# Patient Record
Sex: Male | Born: 1935 | Race: White | Hispanic: No | Smoking: Former smoker
Health system: Southern US, Community
[De-identification: ages and names within clinical notes are randomized; demographics above are authoritative.]

## PROBLEM LIST (undated history)

## (undated) DIAGNOSIS — IMO0002 Reserved for concepts with insufficient information to code with codable children: Secondary | ICD-10-CM

## (undated) DIAGNOSIS — I10 Essential (primary) hypertension: Secondary | ICD-10-CM

## (undated) DIAGNOSIS — E785 Hyperlipidemia, unspecified: Secondary | ICD-10-CM

## (undated) HISTORY — DX: Reserved for concepts with insufficient information to code with codable children: IMO0002

## (undated) HISTORY — DX: Essential (primary) hypertension: I10

## (undated) HISTORY — DX: Hyperlipidemia, unspecified: E78.5

---

## 2011-01-19 ENCOUNTER — Inpatient Hospital Stay (INDEPENDENT_AMBULATORY_CARE_PROVIDER_SITE_OTHER)
Admission: RE | Admit: 2011-01-19 | Discharge: 2011-01-19 | Disposition: A | Discharge status: Home / Self Care | Payer: Medicare Other | Source: Ambulatory Visit | Attending: Family Medicine | Admitting: Family Medicine

## 2011-01-19 DIAGNOSIS — I10 Essential (primary) hypertension: Secondary | ICD-10-CM

## 2011-01-20 ENCOUNTER — Ambulatory Visit (INDEPENDENT_AMBULATORY_CARE_PROVIDER_SITE_OTHER): Payer: Medicare Other | Admitting: Family Medicine

## 2011-01-20 ENCOUNTER — Encounter: Payer: Self-pay | Admitting: Family Medicine

## 2011-01-20 VITALS — BP 170/92 | Temp 99.0°F | Wt 158.5 lb

## 2011-01-20 DIAGNOSIS — I10 Essential (primary) hypertension: Secondary | ICD-10-CM

## 2011-01-20 DIAGNOSIS — N289 Disorder of kidney and ureter, unspecified: Secondary | ICD-10-CM

## 2011-01-20 LAB — POCT I-STAT, CHEM 8
BUN: 31 mg/dL — ABNORMAL HIGH (ref 6–23)
Calcium, Ion: 1.13 mmol/L (ref 1.12–1.32)
Chloride: 107 mEq/L (ref 96–112)
Creatinine, Ser: 1.6 mg/dL — ABNORMAL HIGH (ref 0.4–1.5)

## 2011-01-20 LAB — GLUCOSE, CAPILLARY: Glucose-Capillary: 97 mg/dL (ref 70–99)

## 2011-01-20 MED ORDER — LISINOPRIL-HYDROCHLOROTHIAZIDE 20-12.5 MG PO TABS: 1.0000 | ORAL_TABLET | Freq: Every day | ORAL | Status: DC

## 2011-01-20 NOTE — Progress Notes (Signed)
  Subjective:    Patient ID: Randy Cochran, male    DOB: 1926-08-08, 77 y.o.   MRN: [id number]  HPI Here today to establish care.  Previous MD- [facility]).  Moved here in January.  'This all started out w/ me thinking i was diabetic'.  Went to UC- 'they told me no'.  Pt reports he had 'continual thirst', fatigue.  Has hx of HTN, previously on meds but hasn't taken in 'awhile'.  Hasn't seen a doctor in over 2 years.  Pt reports 'i feel fine'.  No CP, SOB, HA (but 'i have had them recently'), edema, visual changes.  Can't recall what meds he used to take.   Review of Systems For ROS see HPI     Objective:   Physical Exam  Constitutional: He appears well-developed. No distress.       Disheveled, unkempt  HENT:  Head: Normocephalic and atraumatic.  Eyes: Conjunctivae and EOM are normal. Pupils are equal, round, and reactive to light.  Neck: Normal range of motion. Neck supple. No thyromegaly present.  Cardiovascular: Normal rate, regular rhythm and intact distal pulses.   Pulmonary/Chest: Effort normal and breath sounds normal. No respiratory distress. He has no wheezes.  Abdominal: Soft. Bowel sounds are normal. He exhibits no distension. There is no tenderness.  Musculoskeletal: He exhibits no edema.  Lymphadenopathy:    He has no cervical adenopathy.  Neurological: He is alert. He has normal reflexes. No cranial nerve deficit.  Skin: Skin is warm and dry.          Assessment & Plan:

## 2011-01-20 NOTE — Patient Instructions (Signed)
Follow up in 2 weeks to recheck your blood pressure and kidney function Drink plenty of water Take your medicine daily for the blood pressure If you have chest pain, shortness of breath, or other concerns- please call or go to the ER Hang in there!!!

## 2011-01-29 ENCOUNTER — Inpatient Hospital Stay (INDEPENDENT_AMBULATORY_CARE_PROVIDER_SITE_OTHER)
Admission: RE | Admit: 2011-01-29 | Discharge: 2011-01-29 | Disposition: A | Discharge status: Home / Self Care | Payer: Medicare Other | Source: Ambulatory Visit | Attending: Family Medicine | Admitting: Family Medicine

## 2011-01-29 ENCOUNTER — Encounter: Payer: Self-pay | Admitting: Family Medicine

## 2011-01-29 DIAGNOSIS — I10 Essential (primary) hypertension: Secondary | ICD-10-CM | POA: Insufficient documentation

## 2011-01-29 DIAGNOSIS — N289 Disorder of kidney and ureter, unspecified: Secondary | ICD-10-CM | POA: Insufficient documentation

## 2011-01-29 NOTE — Assessment & Plan Note (Signed)
Pt's HTN has not been managed medically for at least 2 yrs.  Start Lisinopril HCTZ to bring down pressure.  Reviewed labs from UC- has renal insufficiency so must follow Cr closely w/ addition of ACE.  May need to select another agent if there is a bump.  Stressed the importance of quitting smoking and the need for regular f/u.  Pt expressed understanding.

## 2011-01-29 NOTE — Assessment & Plan Note (Signed)
Based on labs from UC.  Encouraged increased fluid intake.  Will need to repeat labs due to addition of ACE.  If Cr increases will need to select different agent.

## 2011-02-03 ENCOUNTER — Encounter: Payer: Self-pay | Admitting: *Deleted

## 2011-02-03 ENCOUNTER — Ambulatory Visit (INDEPENDENT_AMBULATORY_CARE_PROVIDER_SITE_OTHER): Payer: Medicare Other | Admitting: Family Medicine

## 2011-02-03 DIAGNOSIS — N289 Disorder of kidney and ureter, unspecified: Secondary | ICD-10-CM

## 2011-02-03 DIAGNOSIS — I10 Essential (primary) hypertension: Secondary | ICD-10-CM

## 2011-02-03 LAB — BASIC METABOLIC PANEL
BUN: 31 mg/dL — ABNORMAL HIGH (ref 6–23)
CO2: 29 mEq/L (ref 19–32)
Calcium: 9 mg/dL (ref 8.4–10.5)
Glucose, Bld: 128 mg/dL — ABNORMAL HIGH (ref 70–99)
Sodium: 140 mEq/L (ref 135–145)

## 2011-02-03 MED ORDER — AMLODIPINE BESYLATE 10 MG PO TABS: 10.0000 mg | ORAL_TABLET | Freq: Every day | ORAL | Status: DC

## 2011-02-03 NOTE — Progress Notes (Signed)
  Subjective:    Patient ID: Randy Cochran, male    DOB: 1926/09/13, 77 y.o.   MRN: [id number]  HPI HTN- pt's BP remains elevated.  Went back to UC over the weekend for BP of 220/90.  Home BPs ranging 145-221/68-101.  'i just don't feel good'.  Increased HAs.  No CP, SOB, edema.  Slight dizziness.  Taking Lisinopril BID at advice of UC.   Review of Systems For ROS see HPI     Objective:   Physical Exam Constitutional: He appears well-developed. No distress.       Disheveled, unkempt  HENT:  Head: Normocephalic and atraumatic.  Eyes: Conjunctivae and EOM are normal. Pupils are equal, round, and reactive to light.  Neck: Normal range of motion. Neck supple. No thyromegaly present.  Cardiovascular: Normal rate, regular rhythm and intact distal pulses.   Pulmonary/Chest: Effort normal and breath sounds normal. No respiratory distress. He has no wheezes.  Abdominal: Soft. Bowel sounds are normal. He exhibits no distension. There is no tenderness.  Musculoskeletal: He exhibits no edema.  Lymphadenopathy:    He has no cervical adenopathy.  Neurological: He is alert. He has normal reflexes. No cranial nerve deficit.  Skin: Skin is warm and dry.        Assessment & Plan:

## 2011-02-03 NOTE — Patient Instructions (Signed)
Follow up in 2 weeks to recheck blood pressure We'll notify you of your lab results Add the Amlodipine daily Continue taking the Lisinopril twice daily Call with any questions or concerns Hang in there!

## 2011-02-05 NOTE — Assessment & Plan Note (Signed)
BP remains poorly controlled despite taking Lisinopril bid.  Will recheck BMP due to pt's renal insufficiency.  Add amlodipine to better control pressure.  Stressed the importance of smoking cessation to improve BP.  Will continue to follow closely.

## 2011-02-16 ENCOUNTER — Ambulatory Visit: Payer: Medicare Other | Admitting: Family Medicine

## 2011-02-20 ENCOUNTER — Ambulatory Visit: Payer: Self-pay | Admitting: Family Medicine

## 2011-02-24 ENCOUNTER — Ambulatory Visit (INDEPENDENT_AMBULATORY_CARE_PROVIDER_SITE_OTHER): Payer: Medicare Other | Admitting: Family Medicine

## 2011-02-24 VITALS — BP 120/68 | HR 65 | Temp 98.8°F | Wt 156.0 lb

## 2011-02-24 DIAGNOSIS — I10 Essential (primary) hypertension: Secondary | ICD-10-CM

## 2011-02-24 NOTE — Progress Notes (Signed)
  Subjective:    Patient ID: Randy Cochran, male    DOB: 1926-09-24, 77 y.o.   MRN: [id number]  HPI HTN- home BPs for last 10 days have ranged 112-140/65-75.  Reports feeling better since BP controlled.  No CP, SOB, HAs, edema.   Review of Systems For ROS see HPI     Objective:   Physical Exam Constitutional: He appears well-developed. No distress.       Disheveled, unkempt  HENT:  Head: Normocephalic and atraumatic.  Eyes: Conjunctivae and EOM are normal. Pupils are equal, round, and reactive to light.  Neck: Normal range of motion. Neck supple. No thyromegaly present.  Cardiovascular: Normal rate, regular rhythm and intact distal pulses.   Pulmonary/Chest: Effort normal and breath sounds normal. No respiratory distress. He has no wheezes.  Abdominal: Soft. Bowel sounds are normal. He exhibits no distension. There is no tenderness.  Musculoskeletal: He exhibits no edema.  Lymphadenopathy:    He has no cervical adenopathy.       Assessment & Plan:

## 2011-02-24 NOTE — Assessment & Plan Note (Signed)
Pt's BP now controlled.  Asymptomatic.  No changes at this time.  Will continue to follow at future visits.

## 2011-02-24 NOTE — Patient Instructions (Signed)
Follow up in 3 months for your complete physical Continue your blood pressure meds daily Call if you have any questions or concerns Have a great holiday!!!

## 2011-04-03 ENCOUNTER — Ambulatory Visit (INDEPENDENT_AMBULATORY_CARE_PROVIDER_SITE_OTHER): Payer: Medicare Other | Admitting: Family Medicine

## 2011-04-03 ENCOUNTER — Telehealth: Payer: Self-pay | Admitting: Family Medicine

## 2011-04-03 VITALS — BP 100/60 | Temp 98.7°F | Wt 154.8 lb

## 2011-04-03 DIAGNOSIS — H6690 Otitis media, unspecified, unspecified ear: Secondary | ICD-10-CM

## 2011-04-03 DIAGNOSIS — J029 Acute pharyngitis, unspecified: Secondary | ICD-10-CM

## 2011-04-03 MED ORDER — AMOXICILLIN 500 MG PO CAPS: 500.0000 mg | ORAL_CAPSULE | Freq: Two times a day (BID) | ORAL | Status: AC

## 2011-04-03 NOTE — Patient Instructions (Signed)
Take the Amoxicillin for the ear infection- take w/ food to avoid upset stomach Use tylenol as needed for pain or fever Drink plenty of fluids Call with any questions or concerns Hang in there!!!

## 2011-04-03 NOTE — Telephone Encounter (Signed)
He can come at 4pm- please don't be late

## 2011-04-03 NOTE — Progress Notes (Signed)
  Subjective:    Patient ID: Randy Cochran, male    DOB: 1926-06-06, 77 y.o.   MRN: [id number]  HPI URI- sxs started w/ sore throat.  Now w/ low grade temps.  Slight cough.  Denies nasal congestion.  + L ear pain.  Denies SOB.  No known sick contacts.   Review of Systems For ROS see HPI     Objective:   Physical Exam  Constitutional: He appears well-developed. No distress.       Disheveled, unkempt  HENT:  Head: Normocephalic and atraumatic.  Nose: Nose normal.  Mouth/Throat: Oropharynx is clear and moist.       R TM normal, L TM dull, erythematous, poor landmarks  Eyes: Conjunctivae and EOM are normal. Pupils are equal, round, and reactive to light.  Neck: Normal range of motion. Neck supple.  Cardiovascular: Normal rate, regular rhythm and normal heart sounds.   Pulmonary/Chest: Effort normal and breath sounds normal. No respiratory distress. He has no wheezes. He has no rales.  Lymphadenopathy:    He has no cervical adenopathy.          Assessment & Plan:

## 2011-04-03 NOTE — Telephone Encounter (Signed)
Patient has appt with Dr Beverely Low at 4:00 on 7/2---front desk is aware

## 2011-04-03 NOTE — Assessment & Plan Note (Signed)
Pt's L ear infected.  Start abx.  Reviewed supportive care and red flags that should prompt return.  Pt expressed understanding and is in agreement w/ plan.

## 2011-04-07 ENCOUNTER — Ambulatory Visit: Payer: Medicare Other | Admitting: Family Medicine

## 2011-04-13 ENCOUNTER — Other Ambulatory Visit: Payer: Self-pay | Admitting: Family Medicine

## 2011-04-13 ENCOUNTER — Inpatient Hospital Stay (INDEPENDENT_AMBULATORY_CARE_PROVIDER_SITE_OTHER)
Admission: RE | Admit: 2011-04-13 | Discharge: 2011-04-13 | Disposition: A | Discharge status: Home / Self Care | Payer: Medicare Other | Source: Ambulatory Visit | Attending: Family Medicine | Admitting: Family Medicine

## 2011-04-13 DIAGNOSIS — J029 Acute pharyngitis, unspecified: Secondary | ICD-10-CM

## 2011-04-13 DIAGNOSIS — H6120 Impacted cerumen, unspecified ear: Secondary | ICD-10-CM

## 2011-04-13 NOTE — Telephone Encounter (Signed)
Refill sent.

## 2011-04-14 ENCOUNTER — Ambulatory Visit: Payer: Medicare Other | Admitting: Family Medicine

## 2011-05-07 ENCOUNTER — Inpatient Hospital Stay (INDEPENDENT_AMBULATORY_CARE_PROVIDER_SITE_OTHER)
Admission: RE | Admit: 2011-05-07 | Discharge: 2011-05-07 | Disposition: A | Discharge status: Home / Self Care | Payer: Medicare Other | Source: Ambulatory Visit | Attending: Family Medicine | Admitting: Family Medicine

## 2011-05-07 ENCOUNTER — Inpatient Hospital Stay (HOSPITAL_COMMUNITY): Admission: RE | Admit: 2011-05-07 | Payer: Medicare Other | Source: Ambulatory Visit

## 2011-05-07 DIAGNOSIS — J029 Acute pharyngitis, unspecified: Secondary | ICD-10-CM

## 2011-05-09 ENCOUNTER — Emergency Department (HOSPITAL_COMMUNITY): Payer: Medicare Other

## 2011-05-09 ENCOUNTER — Emergency Department (HOSPITAL_COMMUNITY)
Admission: EM | Admit: 2011-05-09 | Discharge: 2011-05-10 | Disposition: A | Discharge status: Home / Self Care | Payer: Medicare Other | Attending: Emergency Medicine | Admitting: Emergency Medicine

## 2011-05-09 DIAGNOSIS — R599 Enlarged lymph nodes, unspecified: Secondary | ICD-10-CM | POA: Insufficient documentation

## 2011-05-09 DIAGNOSIS — J029 Acute pharyngitis, unspecified: Secondary | ICD-10-CM | POA: Insufficient documentation

## 2011-05-09 DIAGNOSIS — I10 Essential (primary) hypertension: Secondary | ICD-10-CM | POA: Insufficient documentation

## 2011-05-09 DIAGNOSIS — N289 Disorder of kidney and ureter, unspecified: Secondary | ICD-10-CM | POA: Insufficient documentation

## 2011-05-09 DIAGNOSIS — R509 Fever, unspecified: Secondary | ICD-10-CM | POA: Insufficient documentation

## 2011-05-09 LAB — COMPREHENSIVE METABOLIC PANEL
BUN: 48 mg/dL — ABNORMAL HIGH (ref 6–23)
Calcium: 8.8 mg/dL (ref 8.4–10.5)
GFR calc Af Amer: 33 mL/min — ABNORMAL LOW (ref >60)
Glucose, Bld: 187 mg/dL — ABNORMAL HIGH (ref 70–99)
Total Protein: 6.9 g/dL (ref 6.0–8.3)

## 2011-05-09 LAB — CBC
HCT: 34.5 % — ABNORMAL LOW (ref 39.0–52.0)
Hemoglobin: 11.2 g/dL — ABNORMAL LOW (ref 13.0–17.0)
MCH: 29.9 pg (ref 26.0–34.0)
MCHC: 32.5 g/dL (ref 30.0–36.0)

## 2011-05-09 LAB — URINALYSIS, ROUTINE W REFLEX MICROSCOPIC
Leukocytes, UA: NEGATIVE
Nitrite: NEGATIVE
Specific Gravity, Urine: 1.017 (ref 1.005–1.030)
pH: 6 (ref 5.0–8.0)

## 2011-05-09 LAB — DIFFERENTIAL
Basophils Relative: 0 % (ref 0–1)
Eosinophils Relative: 8 % — ABNORMAL HIGH (ref 0–5)
Lymphocytes Relative: 4 % — ABNORMAL LOW (ref 12–46)
Monocytes Absolute: 0.9 10*3/uL (ref 0.1–1.0)
Monocytes Relative: 7 % (ref 3–12)
Neutro Abs: 10.8 10*3/uL — ABNORMAL HIGH (ref 1.7–7.7)

## 2011-05-10 LAB — STREP A DNA PROBE

## 2011-05-15 ENCOUNTER — Ambulatory Visit (INDEPENDENT_AMBULATORY_CARE_PROVIDER_SITE_OTHER): Payer: Medicare Other | Admitting: Family Medicine

## 2011-05-15 ENCOUNTER — Encounter: Payer: Self-pay | Admitting: Family Medicine

## 2011-05-15 DIAGNOSIS — B349 Viral infection, unspecified: Secondary | ICD-10-CM

## 2011-05-15 DIAGNOSIS — N289 Disorder of kidney and ureter, unspecified: Secondary | ICD-10-CM

## 2011-05-15 DIAGNOSIS — E860 Dehydration: Secondary | ICD-10-CM

## 2011-05-15 DIAGNOSIS — B9789 Other viral agents as the cause of diseases classified elsewhere: Secondary | ICD-10-CM

## 2011-05-15 NOTE — Assessment & Plan Note (Signed)
No evidence of bacterial infxn on exam and pt is completing course of Amox.  Encouraged him to finish meds as directed and follow supportive care measures as outlined in ER instructions; rest, fluids.  As stressed the importance of hygiene in good health.  Pt and son express understanding.

## 2011-05-15 NOTE — Assessment & Plan Note (Signed)
Cr was elevated at recent ER visit.  Discussed w/ pt and son that this is likely due to recent illness and poor fluid intake.  Son reports pt 'perked up' after IV fluids- will need to check Cr today.  If Cr remains elevated may need to switch pt off ACE.

## 2011-05-15 NOTE — Patient Instructions (Signed)
This is most likely a virus and should continue to improve w/ time We'll notify you of your lab results Goal is to drink 1 bottle of water in the morning and 1 in the afternoon Your blood pressure looks good- keep up the good work Call with any questions or concerns Hang in there!

## 2011-05-15 NOTE — Assessment & Plan Note (Signed)
Stressed importance of increased fluid intake.  Goal for pt is now 2 bottles of water daily- 1 in the morning and 1 in the afternoon.  Son aware of these recommendations.  Will follow.

## 2011-05-15 NOTE — Progress Notes (Signed)
  Subjective:    Patient ID: Randy Cochran, male    DOB: Jul 20, 1926, 77 y.o.   MRN: [id number]  HPI ER f/u- was seen on 8/8 w/ temp of 102.2 2 days after going to UC and starting Amox for pharyngitis.  In ER Cr was 2.28 and Glu 187.  Pt was afebrile after 1 dose of tylenol.  ER dx'd him w/ viral syndrome.  Today pt reports he still has a sore throat.  L sided ear pain and throat pain.  Last known fever was in the ER.  Still taking the Amoxicillin.  Son reports he rarely drinks water and this is ongoing 'battle' between them.   Review of Systems For ROS see HPI     Objective:   Physical Exam  Vitals reviewed. Constitutional: He is oriented to person, place, and time. No distress.       Disheveled, smells of urine and BO.  Clothes are not clean and fit poorly.  HENT:  Head: Normocephalic and atraumatic.  Nose: Nose normal.  Mouth/Throat: Oropharynx is clear and moist. No oropharyngeal exudate.       No TTP over sinuses TMs WNL bilaterally  Neck: Normal range of motion. Neck supple.  Pulmonary/Chest: Effort normal and breath sounds normal. No respiratory distress. He has no wheezes. He has no rales.  Lymphadenopathy:    He has no cervical adenopathy.  Neurological: He is alert and oriented to person, place, and time. No cranial nerve deficit. Coordination (shuffling gait) abnormal.  Skin: Skin is warm and dry. There is pallor.  Psychiatric: He has a normal mood and affect. His behavior is normal. Judgment and thought content normal.          Assessment & Plan:

## 2011-05-16 LAB — CBC WITH DIFFERENTIAL/PLATELET
Basophils Absolute: 0 10*3/uL (ref 0.0–0.1)
Eosinophils Absolute: 1.4 10*3/uL — ABNORMAL HIGH (ref 0.0–0.7)
Eosinophils Relative: 16.1 % — ABNORMAL HIGH (ref 0.0–5.0)
MCHC: 33.5 g/dL (ref 30.0–36.0)
MCV: 93.4 fl (ref 78.0–100.0)
Monocytes Absolute: 1 10*3/uL (ref 0.1–1.0)
Neutrophils Relative %: 49 % (ref 43.0–77.0)
Platelets: 387 10*3/uL (ref 150.0–400.0)
WBC: 8.5 10*3/uL (ref 4.5–10.5)

## 2011-05-16 LAB — CULTURE, BLOOD (ROUTINE X 2): Culture: NO GROWTH

## 2011-05-16 LAB — BASIC METABOLIC PANEL
BUN: 29 mg/dL — ABNORMAL HIGH (ref 6–23)
Chloride: 108 mEq/L (ref 96–112)
Creatinine, Ser: 1.3 mg/dL (ref 0.4–1.5)

## 2011-05-18 ENCOUNTER — Telehealth: Payer: Self-pay

## 2011-05-18 NOTE — Telephone Encounter (Signed)
Labs mailed

## 2011-05-18 NOTE — Telephone Encounter (Signed)
Message copied by Beverely Low on Thu May 18, 2011 10:52 AM ------      Message from: Sheliah Hatch      Created: Tue May 16, 2011  1:53 PM       Kidney function and potassium are much improved.  No evidence of viral or bacterial infection.

## 2011-05-29 ENCOUNTER — Encounter (INDEPENDENT_AMBULATORY_CARE_PROVIDER_SITE_OTHER): Payer: Medicare Other | Admitting: Ophthalmology

## 2011-05-29 DIAGNOSIS — H3530 Unspecified macular degeneration: Secondary | ICD-10-CM

## 2011-05-29 DIAGNOSIS — H353290 Exudative age-related macular degeneration, unspecified eye, stage unspecified: Secondary | ICD-10-CM

## 2011-05-29 DIAGNOSIS — H4010X0 Unspecified open-angle glaucoma, stage unspecified: Secondary | ICD-10-CM

## 2011-05-29 DIAGNOSIS — H2510 Age-related nuclear cataract, unspecified eye: Secondary | ICD-10-CM

## 2011-05-30 ENCOUNTER — Ambulatory Visit (INDEPENDENT_AMBULATORY_CARE_PROVIDER_SITE_OTHER): Payer: Medicare Other | Admitting: Family Medicine

## 2011-05-30 ENCOUNTER — Encounter: Payer: Self-pay | Admitting: Family Medicine

## 2011-05-30 DIAGNOSIS — Z0000 Encounter for general adult medical examination without abnormal findings: Secondary | ICD-10-CM

## 2011-05-30 DIAGNOSIS — I10 Essential (primary) hypertension: Secondary | ICD-10-CM

## 2011-05-30 LAB — CBC WITH DIFFERENTIAL/PLATELET
Basophils Relative: 0.8 % (ref 0.0–3.0)
Eosinophils Relative: 8.4 % — ABNORMAL HIGH (ref 0.0–5.0)
Lymphocytes Relative: 29.9 % (ref 12.0–46.0)
Monocytes Relative: 9.7 % (ref 3.0–12.0)
Neutrophils Relative %: 51.2 % (ref 43.0–77.0)
RBC: 4.17 Mil/uL — ABNORMAL LOW (ref 4.22–5.81)
WBC: 8.2 10*3/uL (ref 4.5–10.5)

## 2011-05-30 LAB — TSH: TSH: 2.55 u[IU]/mL (ref 0.35–5.50)

## 2011-05-30 LAB — HEPATIC FUNCTION PANEL
ALT: 18 U/L (ref 0–53)
AST: 19 U/L (ref 0–37)
Albumin: 3.9 g/dL (ref 3.5–5.2)
Alkaline Phosphatase: 95 U/L (ref 39–117)

## 2011-05-30 LAB — LIPID PANEL: VLDL: 40.2 mg/dL — ABNORMAL HIGH (ref 0.0–40.0)

## 2011-05-30 LAB — LDL CHOLESTEROL, DIRECT: Direct LDL: 146.3 mg/dL

## 2011-05-30 LAB — BASIC METABOLIC PANEL
BUN: 32 mg/dL — ABNORMAL HIGH (ref 6–23)
Chloride: 107 mEq/L (ref 96–112)
Glucose, Bld: 90 mg/dL (ref 70–99)
Potassium: 3.9 mEq/L (ref 3.5–5.1)

## 2011-05-30 NOTE — Progress Notes (Signed)
  Subjective:    Patient ID: Randy Cochran, male    DOB: [birth year], 77 y.o.   MRN: [id number]  HPI Here today for CPE.  Risk Factors: HTN- chronic problem for pt, fair control today.  Asymptomatic. Glaucoma- pt has 2 eye doctors, Dr Shea Evans and Dr Ashley Royalty.  Is on multiple eye drops.  Son is confused by dx's and meds.   Physical Activity: limited activity Fall risk: denies fall risk, has shuffling gait Depression: denies sxs of depression Hearing: has hearing loss, previously had hearing aides, would like a referral for an audiologist ADL's: independent Cognitive: normal linear thought process, able to answer questions appropriately Home Safety: lives w/ son, feels safe at home Height, Weight, BMI, Visual Acuity: see vitals, vision corrected to 20/20 w/ glasses Counseling: colonoscopy 'a long time ago', family not interested in another.  No needs for PSA at this time. Labs Ordered: See A&P Care Plan: See A&P    Review of Systems Patient reports no vision changes, anorexia, fever ,adenopathy, persistant/recurrent hoarseness, swallowing issues, chest pain, palpitations, edema, persistant/recurrent cough, hemoptysis, dyspnea (rest,exertional, paroxysmal nocturnal), gastrointestinal  bleeding (melena, rectal bleeding), abdominal pain, excessive heart burn, GU symptoms (dysuria, hematuria, voiding/incontinence issues) syncope, focal weakness, memory loss, numbness & tingling, skin/hair/nail changes, depression, anxiety, abnormal bruising/bleeding, musculoskeletal symptoms/signs.     Objective:   Physical Exam BP 140/68  Pulse 64  Temp(Src) 98.6 F (37 C) (Oral)  Resp 18  Ht 5' 5.5" (1.664 m)  Wt 158 lb (71.668 kg)  BMI 25.89 kg/m2  SpO2 98%  General Appearance:    Alert, cooperative, no distress, appears stated age.  Cleaner than usual but smells of urine and BO  Head:    Normocephalic, without obvious abnormality, atraumatic  Eyes:    PERRL, conjunctiva/corneas clear, EOM's intact        Ears:    Normal TM's and external ear canals, both ears  Nose:   Nares normal, septum midline, mucosa normal, no drainage   or sinus tenderness  Throat:   Lips, mucosa, and tongue normal; teeth in poor repair  Neck:   Supple, symmetrical, trachea midline, no adenopathy;       thyroid:  No enlargement/tenderness/nodules  Back:     Symmetric, no curvature, ROM normal, no CVA tenderness  Lungs:     Clear to auscultation bilaterally, respirations unlabored  Chest wall:    No tenderness or deformity  Heart:    Regular rate and rhythm, S1 and S2 normal, no murmur, rub   or gallop  Abdomen:     Soft, non-tender, bowel sounds active all four quadrants,    no masses, no organomegaly  Genitalia:    Normal male without lesion, masses, discharge or tenderness  Rectal:    Normal tone, normal prostate, no masses or tenderness;   guaiac negative stool  Extremities:   Extremities normal, atraumatic, no cyanosis or edema  Pulses:   2+ and symmetric all extremities  Skin:   Skin color, texture, turgor normal, no rashes or lesions  Lymph nodes:   Cervical, supraclavicular, and axillary nodes normal  Neurologic:   CNII-XII intact. Normal strength, sensation and reflexes      throughout          Assessment & Plan:

## 2011-05-30 NOTE — Patient Instructions (Signed)
Follow up in 6 months to recheck blood pressure Someone will call you with your hearing appt Continue your meds We'll notify you of your lab results Call with any questions or concerns Happy Labor Day!

## 2011-05-31 ENCOUNTER — Encounter: Payer: Self-pay | Admitting: Family Medicine

## 2011-05-31 ENCOUNTER — Telehealth: Payer: Self-pay

## 2011-05-31 NOTE — Telephone Encounter (Signed)
Labs mailed

## 2011-05-31 NOTE — Telephone Encounter (Signed)
Message copied by Beverely Low on Wed May 31, 2011  8:42 AM ------      Message from: Sheliah Hatch      Created: Wed May 31, 2011  8:09 AM       Labs look good!  No changes at this time.  Cholesterol and triglycerides are mildly elevated but will improve w/ attention to healthy diet and some regular walking

## 2011-06-01 NOTE — Assessment & Plan Note (Signed)
Pt's PE WNL w/ exception of odor.  Pt and family not interested in colonoscopy.  Heme (-) in office.  No need for PSA at this time.  Anticipatory guidance reviewed w/ pt and son.

## 2011-06-01 NOTE — Assessment & Plan Note (Signed)
BP fairly well controlled.  Due for labs to risk stratify.

## 2011-09-11 ENCOUNTER — Encounter (INDEPENDENT_AMBULATORY_CARE_PROVIDER_SITE_OTHER): Payer: Medicare Other | Admitting: Ophthalmology

## 2011-10-16 ENCOUNTER — Encounter (HOSPITAL_COMMUNITY): Payer: Self-pay

## 2011-10-16 ENCOUNTER — Emergency Department (INDEPENDENT_AMBULATORY_CARE_PROVIDER_SITE_OTHER)
Admission: EM | Admit: 2011-10-16 | Discharge: 2011-10-16 | Disposition: A | Discharge status: Home / Self Care | Payer: Medicare Other | Source: Home / Self Care | Attending: Emergency Medicine | Admitting: Emergency Medicine

## 2011-10-16 ENCOUNTER — Emergency Department (INDEPENDENT_AMBULATORY_CARE_PROVIDER_SITE_OTHER): Payer: Medicare Other

## 2011-10-16 ENCOUNTER — Emergency Department (HOSPITAL_COMMUNITY): Payer: Medicare Other

## 2011-10-16 DIAGNOSIS — R209 Unspecified disturbances of skin sensation: Secondary | ICD-10-CM

## 2011-10-16 DIAGNOSIS — M19049 Primary osteoarthritis, unspecified hand: Secondary | ICD-10-CM

## 2011-10-16 DIAGNOSIS — R202 Paresthesia of skin: Secondary | ICD-10-CM

## 2011-10-16 MED ORDER — MELOXICAM 7.5 MG PO TABS: 7.5000 mg | ORAL_TABLET | Freq: Every day | ORAL | Status: AC

## 2011-10-16 NOTE — ED Notes (Signed)
Patient c/o right hand pain and numbness for 2 weeks; denies injury, denies problem w grip

## 2011-10-16 NOTE — ED Provider Notes (Signed)
History     CSN: [id number]  Arrival date & time 10/16/11  1332   First MD Initiated Contact with Patient 10/16/11 1511      Chief Complaint  Patient presents with  . Hand Problem    (Consider location/radiation/quality/duration/timing/severity/associated sxs/prior treatment) HPI Comments: Thumb pain and numbness on and off for about two weeks" " they couldn't see him at his doctors office" "He has not injured his hand and has not fallen" (son states), "its hurts all over here" Lasecki states pointing at dorsal aspect of 1st MPJ) NO INJURIES, NO REDNESS, NO SWELLING  The history is provided by the patient and a relative.    Past Medical History  Diagnosis Date  . Hypertension   . Hyperlipidemia   . Ulcer     History reviewed. No pertinent past surgical history.  Family History  Problem Relation Age of Onset  . Heart disease Father   . Heart attack Father   . Hypertension Mother     History  Substance Use Topics  . Smoking status: Former Smoker -- 0.5 packs/day  . Smokeless tobacco: Not on file  . Alcohol Use: No      Review of Systems  Constitutional: Negative for fever.  Musculoskeletal: Positive for joint swelling.  Skin: Negative for color change.  Neurological: Positive for numbness. Negative for tremors, weakness and headaches.    Allergies  Review of patient's allergies indicates no known allergies.  Home Medications   Current Outpatient Rx  Name Route Sig Dispense Refill  . AMLODIPINE BESYLATE 10 MG PO TABS Oral Take 1 tablet (10 mg total) by mouth daily. 30 tablet 3  . AMOXICILLIN 500 MG PO TABS Oral Take 500 mg by mouth 2 (two) times daily.      Marland Kitchen BRIMONIDINE TARTRATE-TIMOLOL 0.2-0.5 % OP SOLN Both Eyes Place 1 drop into both eyes every 12 (twelve) hours.      Marland Kitchen LATANOPROST 0.005 % OP SOLN  1 drop at bedtime.      Marland Kitchen LISINOPRIL-HYDROCHLOROTHIAZIDE 20-12.5 MG PO TABS  TAKE ONE TABLET BY MOUTH DAILY 30 tablet 6  . MELOXICAM 7.5 MG PO TABS Oral Take  1 tablet (7.5 mg total) by mouth daily. 14 tablet 0    BP 164/66  Pulse 70  Temp(Src) 98.3 F (36.8 C) (Oral)  Resp 18  SpO2 96%  Physical Exam  Constitutional: He appears well-nourished.  Musculoskeletal: He exhibits tenderness. He exhibits no edema.       Right wrist: He exhibits tenderness and bony tenderness. He exhibits no swelling, no crepitus, no deformity and no laceration.       Arms: Skin: Skin is warm. No abrasion, no bruising, no ecchymosis and no rash noted. No erythema.    ED Course  Procedures (including critical care time)  Labs Reviewed - No data to display Dg Wrist Complete Right  10/16/2011  *RADIOLOGY REPORT*  Clinical Data: Pain in navicular area.  No known injury.  RIGHT WRIST - COMPLETE 3+ VIEW  Comparison: None  Findings: Degenerative changes at the first carpal metacarpal joint. No acute bony abnormality.  Specifically, no fracture, subluxation, or dislocation.  Soft tissues are intact.  IMPRESSION: No acute bony abnormality.  Original Report Authenticated By: Cyndie Chime, M.D.     1. Arthritis of hand   2. Paresthesia       MDM  R thumb pain (non-trauma) x 2 weeks. Able to oppose thumb, and no obvious STS able to discriminate two -points of discrimination- Full  ROM with pain. Moderate arthritis First phalangeal-metacarpal joint. To use splint during the day (rest at night) x 10 days        Jimmie Molly, MD 10/16/11 2010

## 2011-10-21 ENCOUNTER — Encounter (HOSPITAL_COMMUNITY): Payer: Self-pay | Admitting: Emergency Medicine

## 2011-10-21 ENCOUNTER — Emergency Department (INDEPENDENT_AMBULATORY_CARE_PROVIDER_SITE_OTHER)
Admission: EM | Admit: 2011-10-21 | Discharge: 2011-10-21 | Disposition: A | Discharge status: Home / Self Care | Payer: Medicare Other | Source: Home / Self Care | Attending: Family Medicine | Admitting: Family Medicine

## 2011-10-21 DIAGNOSIS — R6889 Other general symptoms and signs: Secondary | ICD-10-CM

## 2011-10-21 MED ORDER — AZITHROMYCIN 250 MG PO TABS: 250.0000 mg | ORAL_TABLET | Freq: Every day | ORAL | Status: AC

## 2011-10-21 NOTE — ED Provider Notes (Signed)
History     CSN: [id number]  Arrival date & time 10/21/11  1659   First MD Initiated Contact with Patient 10/21/11 1854      Chief Complaint  Patient presents with  . URI    (Consider location/radiation/quality/duration/timing/severity/associated sxs/prior treatment) HPI Comments: Randy Cochran presents for evaluation of body aches, feeling hot and cold, minimal cough, and headache. He was seen here recently for pain in his RIGHT thumb and given some medication; his son wonders if this could be related to the medicine (meloxicam).  Patient is a 78 y.o. male presenting with flu symptoms. The history is provided by the patient and a relative.  Influenza This is a new problem. The current episode started more than 2 days ago. The problem occurs constantly. The problem has not changed since onset.Associated symptoms include headaches. Pertinent negatives include no chest pain. The symptoms are aggravated by nothing. The symptoms are relieved by nothing.    Past Medical History  Diagnosis Date  . Hypertension   . Hyperlipidemia   . Ulcer     History reviewed. No pertinent past surgical history.  Family History  Problem Relation Age of Onset  . Heart disease Father   . Heart attack Father   . Hypertension Mother     History  Substance Use Topics  . Smoking status: Former Smoker -- 0.5 packs/day  . Smokeless tobacco: Not on file  . Alcohol Use: No      Review of Systems  Constitutional: Positive for fever and chills.  HENT: Negative for ear pain, congestion, sore throat and rhinorrhea.   Eyes: Negative.   Respiratory: Positive for cough.   Cardiovascular: Negative.  Negative for chest pain.  Gastrointestinal: Negative.   Genitourinary: Negative.   Musculoskeletal: Negative.   Skin: Negative.   Neurological: Positive for headaches.    Allergies  Review of patient's allergies indicates no known allergies.  Home Medications   Current Outpatient Rx  Name Route Sig  Dispense Refill  . ASPIRIN 325 MG PO TABS Oral Take 325 mg by mouth daily.    Marland Kitchen AMLODIPINE BESYLATE 10 MG PO TABS Oral Take 1 tablet (10 mg total) by mouth daily. 30 tablet 3  . AMOXICILLIN 500 MG PO TABS Oral Take 500 mg by mouth 2 (two) times daily.      . AZITHROMYCIN 250 MG PO TABS Oral Take 1 tablet (250 mg total) by mouth daily. Take two tablets on first day, then one tablet each day for four days 6 tablet 0  . BRIMONIDINE TARTRATE-TIMOLOL 0.2-0.5 % OP SOLN Both Eyes Place 1 drop into both eyes every 12 (twelve) hours.      Marland Kitchen LATANOPROST 0.005 % OP SOLN  1 drop at bedtime.      Marland Kitchen LISINOPRIL-HYDROCHLOROTHIAZIDE 20-12.5 MG PO TABS  TAKE ONE TABLET BY MOUTH DAILY 30 tablet 6  . MELOXICAM 7.5 MG PO TABS Oral Take 1 tablet (7.5 mg total) by mouth daily. 14 tablet 0    BP 163/92  Pulse 83  Temp(Src) 97.9 F (36.6 C) (Oral)  Resp 20  SpO2 96%  Physical Exam  Nursing note and vitals reviewed. Constitutional: He is oriented to person, place, and time. He appears well-developed and well-nourished.  HENT:  Head: Normocephalic and atraumatic.  Right Ear: Tympanic membrane normal.  Left Ear: Tympanic membrane normal.  Mouth/Throat: Uvula is midline, oropharynx is clear and moist and mucous membranes are normal.  Eyes: EOM are normal.  Neck: Normal range of motion.  Pulmonary/Chest: Effort  normal and breath sounds normal. He has no wheezes. He has no rhonchi.  Musculoskeletal: Normal range of motion.  Neurological: He is alert and oriented to person, place, and time.  Skin: Skin is warm and dry.  Psychiatric: His behavior is normal.    ED Course  Procedures (including critical care time)  Labs Reviewed - No data to display No results found.   1. Influenza-like illness       MDM  Will treat with azithromycin to cover for CAP        Richardo Priest, MD 10/21/11 2118

## 2011-10-21 NOTE — ED Notes (Signed)
C/o flu -like symptoms: nausea, body aches, chills, sore throat, minimal cough

## 2011-10-25 ENCOUNTER — Emergency Department (INDEPENDENT_AMBULATORY_CARE_PROVIDER_SITE_OTHER)
Admission: EM | Admit: 2011-10-25 | Discharge: 2011-10-25 | Disposition: A | Discharge status: Home / Self Care | Payer: Medicare Other | Source: Home / Self Care | Attending: Emergency Medicine | Admitting: Emergency Medicine

## 2011-10-25 ENCOUNTER — Encounter (HOSPITAL_COMMUNITY): Payer: Self-pay | Admitting: *Deleted

## 2011-10-25 ENCOUNTER — Emergency Department (INDEPENDENT_AMBULATORY_CARE_PROVIDER_SITE_OTHER): Payer: Medicare Other

## 2011-10-25 DIAGNOSIS — R05 Cough: Secondary | ICD-10-CM

## 2011-10-25 MED ORDER — ACETAMINOPHEN-CODEINE #3 300-30 MG PO TABS: 1.0000 | ORAL_TABLET | Freq: Four times a day (QID) | ORAL | Status: AC | PRN

## 2011-10-25 MED ORDER — PREDNISONE 20 MG PO TABS: 40.0000 mg | ORAL_TABLET | Freq: Every day | ORAL | Status: AC

## 2011-10-25 NOTE — ED Provider Notes (Signed)
History     CSN: [id number]  Arrival date & time 10/25/11  1315   First MD Initiated Contact with Patient 10/25/11 1426      Chief Complaint  Patient presents with  . Cough    (Consider location/radiation/quality/duration/timing/severity/associated sxs/prior treatment) HPI Comments: Cough is not doing any better, took antibiotics but nothing else for cough, No SOB, thought that cough should have been gone by now, and its not getting worse"  Patient is a 78 y.o. male presenting with cough. The history is provided by the patient.  Cough This is a recurrent problem. The current episode started more than 1 week ago. The problem occurs constantly. The problem has not changed since onset.The cough is productive of sputum. Associated symptoms include chills. Pertinent negatives include no headaches, no shortness of breath and no wheezing. He has tried decongestants for the symptoms. He is not a smoker. His past medical history does not include COPD or asthma.    Past Medical History  Diagnosis Date  . Hypertension   . Hyperlipidemia   . Ulcer     History reviewed. No pertinent past surgical history.  Family History  Problem Relation Age of Onset  . Heart disease Father   . Heart attack Father   . Hypertension Mother     History  Substance Use Topics  . Smoking status: Former Smoker -- 0.5 packs/day  . Smokeless tobacco: Not on file  . Alcohol Use: No      Review of Systems  Constitutional: Positive for chills. Negative for fever and fatigue.  Respiratory: Positive for cough. Negative for chest tightness, shortness of breath and wheezing.   Skin: Negative for rash.  Neurological: Negative for headaches.    Allergies  Review of patient's allergies indicates no known allergies.  Home Medications   Current Outpatient Rx  Name Route Sig Dispense Refill  . ACETAMINOPHEN-CODEINE #3 300-30 MG PO TABS Oral Take 1-2 tablets by mouth every 6 (six) hours as needed for pain.  15 tablet 0  . AMLODIPINE BESYLATE 10 MG PO TABS Oral Take 1 tablet (10 mg total) by mouth daily. 30 tablet 3  . AMOXICILLIN 500 MG PO TABS Oral Take 500 mg by mouth 2 (two) times daily.      . ASPIRIN 325 MG PO TABS Oral Take 325 mg by mouth daily.    . AZITHROMYCIN 250 MG PO TABS Oral Take 1 tablet (250 mg total) by mouth daily. Take two tablets on first day, then one tablet each day for four days 6 tablet 0  . BRIMONIDINE TARTRATE-TIMOLOL 0.2-0.5 % OP SOLN Both Eyes Place 1 drop into both eyes every 12 (twelve) hours.      Marland Kitchen LATANOPROST 0.005 % OP SOLN  1 drop at bedtime.      Marland Kitchen LISINOPRIL-HYDROCHLOROTHIAZIDE 20-12.5 MG PO TABS  TAKE ONE TABLET BY MOUTH DAILY 30 tablet 6  . MELOXICAM 7.5 MG PO TABS Oral Take 1 tablet (7.5 mg total) by mouth daily. 14 tablet 0  . PREDNISONE 20 MG PO TABS Oral Take 2 tablets (40 mg total) by mouth daily. 2 tablets daily for 5 days 10 tablet 0    BP 173/77  Pulse 68  Temp(Src) 97.1 F (36.2 C) (Oral)  Resp 18  SpO2 100%  Physical Exam  Constitutional: He appears well-developed and well-nourished. No distress.  Pulmonary/Chest: Not tachypneic and not bradypneic. No respiratory distress. He has decreased breath sounds. He has no wheezes. He has no rhonchi. He has no  rales. He exhibits no tenderness.    ED Course  Procedures (including critical care time)  Labs Reviewed - No data to display Dg Chest 2 View  10/25/2011  *RADIOLOGY REPORT*  Clinical Data: Cough.  CHEST - 2 VIEW  Comparison: 05/10/2011  Findings: Heart and mediastinal contours are within normal limits. No focal opacities or effusions.  No acute bony abnormality. Eventration of the right hemidiaphragm, stable.  IMPRESSION: No active cardiopulmonary disease.  Original Report Authenticated By: Cyndie Chime, M.D.     1. Cough       MDM  Returns with recurrent ongoing cough took antibiotic prescribed by previous provider. Comfortable afebrile and non dyspneic, Chest x-ray normal          Jimmie Molly, MD 10/25/11 2018

## 2011-10-25 NOTE — ED Notes (Signed)
Pt  Was  Seen  4  Days  Ago        Was  tx  With  Anti  Biotics  For  Cough      He  aslo  Was  Seen  fot  Thumb injury  As  Well   -  Pt  Continues  To  Have  Cough  And  Symptoms  Are  Perhaps  A  Bit  Worse      He  Is  However  Awake  As  Well as  Alert  He  Is  Speaking in  Complete  sentances

## 2011-10-31 ENCOUNTER — Telehealth: Payer: Self-pay | Admitting: Family Medicine

## 2011-10-31 NOTE — Telephone Encounter (Signed)
Ok for referral to audiology- dx hearing loss 

## 2011-11-01 ENCOUNTER — Telehealth: Payer: Self-pay | Admitting: Family Medicine

## 2011-11-01 NOTE — Telephone Encounter (Signed)
Gave message to Referral Coordinator per pt request.

## 2011-11-01 NOTE — Telephone Encounter (Signed)
Patient's son called the office asking to speak with someone who can help his father Randy Cochran get a referral for his hearing aid. The referral needs to be sent to specialist ( [facility] located at [address]). Their fax number is [phone]. Randy Cochran can be best reached at [phone].

## 2011-11-01 NOTE — Telephone Encounter (Signed)
Referral sent for audiology per hearing loss

## 2011-11-03 ENCOUNTER — Ambulatory Visit (INDEPENDENT_AMBULATORY_CARE_PROVIDER_SITE_OTHER): Payer: Medicare Other | Admitting: Family Medicine

## 2011-11-03 ENCOUNTER — Encounter: Payer: Self-pay | Admitting: Family Medicine

## 2011-11-03 DIAGNOSIS — R05 Cough: Secondary | ICD-10-CM

## 2011-11-03 DIAGNOSIS — R209 Unspecified disturbances of skin sensation: Secondary | ICD-10-CM

## 2011-11-03 DIAGNOSIS — R059 Cough, unspecified: Secondary | ICD-10-CM | POA: Insufficient documentation

## 2011-11-03 DIAGNOSIS — R200 Anesthesia of skin: Secondary | ICD-10-CM

## 2011-11-03 MED ORDER — LORATADINE 10 MG PO TABS: 10.0000 mg | ORAL_TABLET | Freq: Every day | ORAL | Status: DC

## 2011-11-03 NOTE — Assessment & Plan Note (Signed)
Most likely viral/allergy combo due to presence of PND.  No evidence of infection.  No need for abx.  Start Claritin daily.  Cough meds prn.  Reviewed supportive care and red flags that should prompt return.  Pt expressed understanding and is in agreement w/ plan.

## 2011-11-03 NOTE — Progress Notes (Signed)
  Subjective:    Patient ID: Randy Cochran, male    DOB: 08-17-1926, 78 y.o.   MRN: [id number]  HPI Cough- sxs started 'a couple of week ago'.  decribes chest congestion but cough is not productive.  No fevers.  Cough is worse at night.  Denies nasal congestion, sinus pain.  + L ear pain- but this started after getting fitted for hearing aide.  No N/V/D.  + sick contacts.  R thumb numbness- no pain w/ movement.  Was dx'd w/ tendonitis at [facility].  Started on Mobic.  No swelling or redness.  Was recently immobilized for 10 days w/out relief of sxs.  sxs started 2-3 weeks ago.  Denies weakness.   Review of Systems For ROS see HPI     Objective:   Physical Exam  Constitutional: He appears well-developed and well-nourished. No distress.  HENT:  Head: Normocephalic and atraumatic.  Right Ear: Tympanic membrane normal.  Left Ear: Tympanic membrane normal.  Nose: No mucosal edema or rhinorrhea. Right sinus exhibits no maxillary sinus tenderness and no frontal sinus tenderness. Left sinus exhibits no maxillary sinus tenderness and no frontal sinus tenderness.  Mouth/Throat: Mucous membranes are normal. No oropharyngeal exudate, posterior oropharyngeal edema or posterior oropharyngeal erythema.       No TTP over sinuses + turbinate edema + PND TMs normal bilaterally  Eyes: Conjunctivae and EOM are normal. Pupils are equal, round, and reactive to light.  Neck: Normal range of motion. Neck supple.  Cardiovascular: Normal rate, regular rhythm and normal heart sounds.   Pulmonary/Chest: Effort normal and breath sounds normal. No respiratory distress. He has no wheezes.       No cough heard  Musculoskeletal: He exhibits no edema and no tenderness.       R thumb w/out swelling, redness, decreased motion.  Subjective numbness  Lymphadenopathy:    He has no cervical adenopathy.  Skin: Skin is warm and dry.          Assessment & Plan:

## 2011-11-03 NOTE — Assessment & Plan Note (Signed)
Pt w/out any abnormality on PE.  Refer to hand surgery for complete evaluation and tx.  Pt expressed understanding and is in agreement w/ plan.

## 2011-11-03 NOTE — Patient Instructions (Signed)
The cough appears to be a virus/allergy combo and should improve w/ time Start Claritin daily to improve your allergy symptoms Drink plenty of fluids Someone will call you with your hand specialist appt Call with any questions or concerns Hang in there!

## 2011-11-30 ENCOUNTER — Encounter: Payer: Self-pay | Admitting: Family Medicine

## 2011-11-30 ENCOUNTER — Ambulatory Visit (INDEPENDENT_AMBULATORY_CARE_PROVIDER_SITE_OTHER): Payer: Medicare Other | Admitting: Family Medicine

## 2011-11-30 DIAGNOSIS — N289 Disorder of kidney and ureter, unspecified: Secondary | ICD-10-CM

## 2011-11-30 DIAGNOSIS — J309 Allergic rhinitis, unspecified: Secondary | ICD-10-CM

## 2011-11-30 DIAGNOSIS — J302 Other seasonal allergic rhinitis: Secondary | ICD-10-CM | POA: Insufficient documentation

## 2011-11-30 DIAGNOSIS — E785 Hyperlipidemia, unspecified: Secondary | ICD-10-CM | POA: Insufficient documentation

## 2011-11-30 DIAGNOSIS — I10 Essential (primary) hypertension: Secondary | ICD-10-CM

## 2011-11-30 LAB — CBC WITH DIFFERENTIAL/PLATELET
Basophils Relative: 0.6 % (ref 0.0–3.0)
Eosinophils Relative: 5.7 % — ABNORMAL HIGH (ref 0.0–5.0)
HCT: 42.2 % (ref 39.0–52.0)
Hemoglobin: 14.1 g/dL (ref 13.0–17.0)
Lymphs Abs: 2.6 10*3/uL (ref 0.7–4.0)
Monocytes Relative: 8.9 % (ref 3.0–12.0)
Neutro Abs: 6 10*3/uL (ref 1.4–7.7)
Platelets: 312 10*3/uL (ref 150.0–400.0)
RBC: 4.58 Mil/uL (ref 4.22–5.81)
WBC: 10.1 10*3/uL (ref 4.5–10.5)

## 2011-11-30 LAB — HEPATIC FUNCTION PANEL
Albumin: 4.1 g/dL (ref 3.5–5.2)
Alkaline Phosphatase: 93 U/L (ref 39–117)
Total Protein: 7.2 g/dL (ref 6.0–8.3)

## 2011-11-30 LAB — LIPID PANEL: HDL: 41 mg/dL (ref >39.00)

## 2011-11-30 LAB — BASIC METABOLIC PANEL
BUN: 32 mg/dL — ABNORMAL HIGH (ref 6–23)
CO2: 28 mEq/L (ref 19–32)
Calcium: 9 mg/dL (ref 8.4–10.5)
Glucose, Bld: 77 mg/dL (ref 70–99)
Sodium: 140 mEq/L (ref 135–145)

## 2011-11-30 MED ORDER — LISINOPRIL-HYDROCHLOROTHIAZIDE 20-12.5 MG PO TABS: 1.0000 | ORAL_TABLET | Freq: Every day | ORAL | Status: DC

## 2011-11-30 MED ORDER — LORATADINE 10 MG PO TABS: 10.0000 mg | ORAL_TABLET | Freq: Every day | ORAL | Status: AC

## 2011-11-30 MED ORDER — AMLODIPINE BESYLATE 5 MG PO TABS: 5.0000 mg | ORAL_TABLET | Freq: Every day | ORAL | Status: DC

## 2011-11-30 NOTE — Patient Instructions (Signed)
Follow up in 1 month to recheck blood pressure Continue the Lisinopril daily ADD the Norvasc daily We'll notify you of your lab results Call with any questions or concerns Hang in there!!

## 2011-11-30 NOTE — Progress Notes (Signed)
  Subjective:    Patient ID: Randy Cochran, male    DOB: 27-Oct-1925, 78 y.o.   MRN: [id number]  HPI HTN- chronic problem, elevated BP today.  On Lisinopril but no longer taking the Norvasc.  No CP, SOB, edema.  + HA- frontal.  HA will improve w/ 1 ASA.  Hyperlipidemia- labs 6 months ago show elevated trigs and cholesterol.  Not currently on meds, was trying to control w/ diet and exercise.  Renal insufficiency- due for repeat labs.  No urinary complaints.   Review of Systems For ROS see HPI     Objective:   Physical Exam  Vitals reviewed. Constitutional: He is oriented to person, place, and time. He appears well-developed and well-nourished. No distress.  HENT:  Head: Normocephalic and atraumatic.       No TTP over sinuses + turbinate edema + PND TMs normal bilaterally  Eyes: Conjunctivae and EOM are normal. Pupils are equal, round, and reactive to light.  Neck: Normal range of motion. Neck supple. No thyromegaly present.  Cardiovascular: Normal rate, regular rhythm, normal heart sounds and intact distal pulses.   No murmur heard. Pulmonary/Chest: Effort normal and breath sounds normal. No respiratory distress. He has no wheezes.  Abdominal: Soft. Bowel sounds are normal. He exhibits no distension.  Musculoskeletal: He exhibits no edema.  Lymphadenopathy:    He has no cervical adenopathy.  Neurological: He is alert and oriented to person, place, and time. No cranial nerve deficit.  Skin: Skin is warm and dry.  Psychiatric: He has a normal mood and affect. His behavior is normal.          Assessment & Plan:

## 2011-12-03 NOTE — Assessment & Plan Note (Signed)
Chronic problem.  Not well controlled.  Stressed the importance of taking both the Lisinopril HCT and Norvasc.  Will follow closely.

## 2011-12-03 NOTE — Assessment & Plan Note (Signed)
Chronic issue for pt.  Check labs to assess for change.

## 2011-12-03 NOTE — Assessment & Plan Note (Signed)
Chronic problem.  Has been attempting to control w/ diet.  Due for repeat labs today.  Will start med prn.  Pt expressed understanding and is in agreement w/ plan.

## 2011-12-03 NOTE — Assessment & Plan Note (Signed)
New.  This is likely cause of pt's frontal HAs.  Start daily antihistamine.  Script provided.  Pt expressed understanding and is in agreement w/ plan.

## 2011-12-08 ENCOUNTER — Telehealth: Payer: Self-pay

## 2011-12-08 ENCOUNTER — Encounter: Payer: Self-pay | Admitting: Family Medicine

## 2011-12-08 MED ORDER — SIMVASTATIN 20 MG PO TABS: 20.0000 mg | ORAL_TABLET | Freq: Every evening | ORAL | Status: AC

## 2011-12-08 NOTE — Telephone Encounter (Signed)
Spoke with patient's son, all information ok'd, ok for rx to be sent to [facility] on [address]. Copy of report to be mailed per son's request

## 2011-12-08 NOTE — Telephone Encounter (Signed)
Message copied by Maurice Small on Fri Dec 08, 2011  1:58 PM ------      Message from: Sheliah Hatch      Created: Thu Nov 30, 2011  4:48 PM       Kidney function is stable      Total cholesterol, LDL, and trigs are all elevated.  Based on this, would start Simvastatin 20mg  nightly

## 2011-12-28 ENCOUNTER — Ambulatory Visit: Payer: Medicare Other | Admitting: Family Medicine

## 2012-01-01 ENCOUNTER — Encounter: Payer: Self-pay | Admitting: Family Medicine

## 2012-01-01 ENCOUNTER — Ambulatory Visit (INDEPENDENT_AMBULATORY_CARE_PROVIDER_SITE_OTHER): Payer: Medicare Other | Admitting: Family Medicine

## 2012-01-01 VITALS — BP 129/82 | HR 64 | Temp 98.1°F | Ht 64.75 in | Wt 157.8 lb

## 2012-01-01 DIAGNOSIS — I10 Essential (primary) hypertension: Secondary | ICD-10-CM

## 2012-01-01 NOTE — Assessment & Plan Note (Signed)
Improved.  Pt has resumed taking meds as directed.  Asymptomatic.  No changes at this time.  Will continue to follow.

## 2012-01-01 NOTE — Progress Notes (Signed)
  Subjective:    Patient ID: Randy Cochran, male    DOB: 06-03-1926, 78 y.o.   MRN: [id number]  HPI HTN- restarted Norvasc at last vitis.  Now on Lisinopril HCT and Norvasc.  Denies CP, SOB, HAs, visual changes, edema.   Review of Systems For ROS see HPI     Objective:   Physical Exam  Vitals reviewed. Constitutional: He is oriented to person, place, and time. He appears well-developed and well-nourished. No distress.       Disheveled  HENT:  Head: Normocephalic and atraumatic.  Eyes: Conjunctivae and EOM are normal. Pupils are equal, round, and reactive to light.  Neck: Normal range of motion. Neck supple. No thyromegaly present.  Cardiovascular: Normal rate, regular rhythm, normal heart sounds and intact distal pulses.   No murmur heard. Pulmonary/Chest: Effort normal and breath sounds normal. No respiratory distress.  Abdominal: Soft. Bowel sounds are normal. He exhibits no distension.  Musculoskeletal: He exhibits no edema.  Lymphadenopathy:    He has no cervical adenopathy.  Neurological: He is alert and oriented to person, place, and time. No cranial nerve deficit.  Skin: Skin is warm and dry.  Psychiatric: He has a normal mood and affect. His behavior is normal.          Assessment & Plan:

## 2012-01-01 NOTE — Patient Instructions (Signed)
Schedule your complete physical for August- don't eat before this appt Continue the Norvasc and the Lisinopril- your blood pressure looks much better! Call with any questions or concerns Happy Spring!

## 2012-05-02 ENCOUNTER — Encounter: Payer: Medicare Other | Admitting: Family Medicine

## 2012-06-19 ENCOUNTER — Ambulatory Visit: Payer: Medicare Other | Admitting: Family Medicine

## 2012-06-20 ENCOUNTER — Ambulatory Visit (INDEPENDENT_AMBULATORY_CARE_PROVIDER_SITE_OTHER): Payer: Medicare Other | Admitting: Family Medicine

## 2012-06-20 ENCOUNTER — Encounter: Payer: Self-pay | Admitting: Family Medicine

## 2012-06-20 VITALS — BP 130/78 | HR 62 | Temp 98.4°F | Ht 65.0 in | Wt 156.2 lb

## 2012-06-20 DIAGNOSIS — I10 Essential (primary) hypertension: Secondary | ICD-10-CM

## 2012-06-20 DIAGNOSIS — J309 Allergic rhinitis, unspecified: Secondary | ICD-10-CM

## 2012-06-20 DIAGNOSIS — E785 Hyperlipidemia, unspecified: Secondary | ICD-10-CM

## 2012-06-20 DIAGNOSIS — J302 Other seasonal allergic rhinitis: Secondary | ICD-10-CM

## 2012-06-20 LAB — BASIC METABOLIC PANEL
CO2: 25 mEq/L (ref 19–32)
Glucose, Bld: 114 mg/dL — ABNORMAL HIGH (ref 70–99)
Potassium: 3.4 mEq/L — ABNORMAL LOW (ref 3.5–5.1)
Sodium: 139 mEq/L (ref 135–145)

## 2012-06-20 LAB — HEPATIC FUNCTION PANEL
AST: 20 U/L (ref 0–37)
Albumin: 4 g/dL (ref 3.5–5.2)
Alkaline Phosphatase: 111 U/L (ref 39–117)
Total Protein: 7.2 g/dL (ref 6.0–8.3)

## 2012-06-20 NOTE — Progress Notes (Signed)
  Subjective:    Patient ID: Randy Cochran, male    DOB: 1926/09/25, 78 y.o.   MRN: [id number]  HPI HTN- chronic problem, on Norvasc and Lisinopril HCT.  They were checking BP at pharmacy and it was 'really high'.  Switched pharmacy and #s improved.  No CP, SOB, visual changes, edema.  + HA.  HAs- occuring daily, across the top of the head.  sxs started 'several weeks ago'.  Improves w/ ASA.  Nothing makes them worse.  Some nasal congestion.  Recently started taking Claritin daily- previously taking as needed.  HAs have improved since changing Claritin.  Hyperlipidemia- dx'd in 2/13 and was supposed to start Zocor.  Did not start med.   Review of Systems For ROS see HPI     Objective:   Physical Exam  Vitals reviewed. Constitutional: He is oriented to person, place, and time. He appears well-developed and well-nourished. No distress.       Disheveled, malodorous  HENT:  Head: Normocephalic and atraumatic.       + edematous nasal turbinates + PND No TTP over sinuses  Eyes: Conjunctivae normal and EOM are normal. Pupils are equal, round, and reactive to light.  Neck: Normal range of motion. Neck supple. No thyromegaly present.  Cardiovascular: Normal rate, regular rhythm, normal heart sounds and intact distal pulses.   No murmur heard. Pulmonary/Chest: Effort normal and breath sounds normal. No respiratory distress.  Abdominal: Soft. Bowel sounds are normal. He exhibits no distension.  Musculoskeletal: He exhibits no edema.  Lymphadenopathy:    He has no cervical adenopathy.  Neurological: He is alert and oriented to person, place, and time. No cranial nerve deficit.  Skin: Skin is warm and dry.  Psychiatric: He has a normal mood and affect. His behavior is normal.          Assessment & Plan:

## 2012-06-20 NOTE — Assessment & Plan Note (Signed)
Chronic problem, never started Simvastatin.  Will check labs today and start meds prn.  Pt expressed understanding and is in agreement w/ plan.

## 2012-06-20 NOTE — Patient Instructions (Addendum)
Schedule your complete physical in 6 months We'll notify you of your lab results and make any changes if needed (including starting cholesterol meds if needed) Continue the claritin daily for the seasonal allergies Tylenol as needed for your headaches Drink plenty of fluids Call with any questions or concerns Hang in there!!

## 2012-06-20 NOTE — Addendum Note (Signed)
Addended by: Derry Lory A on: 06/20/2012 02:31 PM   Modules accepted: Orders

## 2012-06-20 NOTE — Assessment & Plan Note (Signed)
Chronic problem.  Adequate control today.  Asymptomatic.  Check labs.  Will continue to follow closely.

## 2012-06-20 NOTE — Assessment & Plan Note (Signed)
Deteriorated.  This is likely cause of pt's HAs as he reports they have improved since starting Claritin daily.  Reviewed importance of daily use w/ both pt and son- they expressed understanding.

## 2012-06-21 ENCOUNTER — Encounter: Payer: Medicare Other | Admitting: Family Medicine

## 2012-07-05 ENCOUNTER — Telehealth: Payer: Self-pay | Admitting: *Deleted

## 2012-07-05 NOTE — Telephone Encounter (Signed)
Called patient home number. Unable to leave message. Voice mail box is full.

## 2012-07-05 NOTE — Telephone Encounter (Signed)
Message copied by Elnora Morrison on Fri Jul 05, 2012 12:31 PM ------      Message from: Sheliah Hatch      Created: Thu Jun 20, 2012  4:46 PM       Total cholesterol, trigs and LDL are all elevated.  Needs to start Lipitor 10mg  nightly.  Please make sure pt and sun understand.

## 2012-07-09 ENCOUNTER — Encounter: Payer: Self-pay | Admitting: *Deleted

## 2012-07-09 MED ORDER — ATORVASTATIN CALCIUM 10 MG PO TABS: 10.0000 mg | ORAL_TABLET | Freq: Every day | ORAL | Status: DC

## 2012-07-09 NOTE — Addendum Note (Signed)
Addended by: Derry Lory A on: 07/09/2012 12:03 PM   Modules accepted: Orders

## 2012-07-16 ENCOUNTER — Other Ambulatory Visit: Payer: Self-pay | Admitting: Family Medicine

## 2012-07-16 MED ORDER — LISINOPRIL-HYDROCHLOROTHIAZIDE 20-12.5 MG PO TABS: 1.0000 | ORAL_TABLET | Freq: Every day | ORAL | Status: DC

## 2012-07-16 NOTE — Telephone Encounter (Signed)
rx sent to pharmacy by e-script  

## 2012-07-16 NOTE — Telephone Encounter (Signed)
LISINOP/HCTZ 20 -12.5 TAB QTY :30 TAKE ONE TABLET BY MOUTH EVERYDAY

## 2012-07-23 ENCOUNTER — Telehealth: Payer: Self-pay | Admitting: Family Medicine

## 2012-07-29 ENCOUNTER — Encounter: Payer: Medicare Other | Admitting: Family Medicine

## 2012-08-04 IMAGING — CR DG CHEST 2V
2 series · 2 of 2 positions shown · non-contrast
Comparison: None.

CLINICAL DATA: Fever, weakness

CHEST - 2 VIEW

[w chest lat]
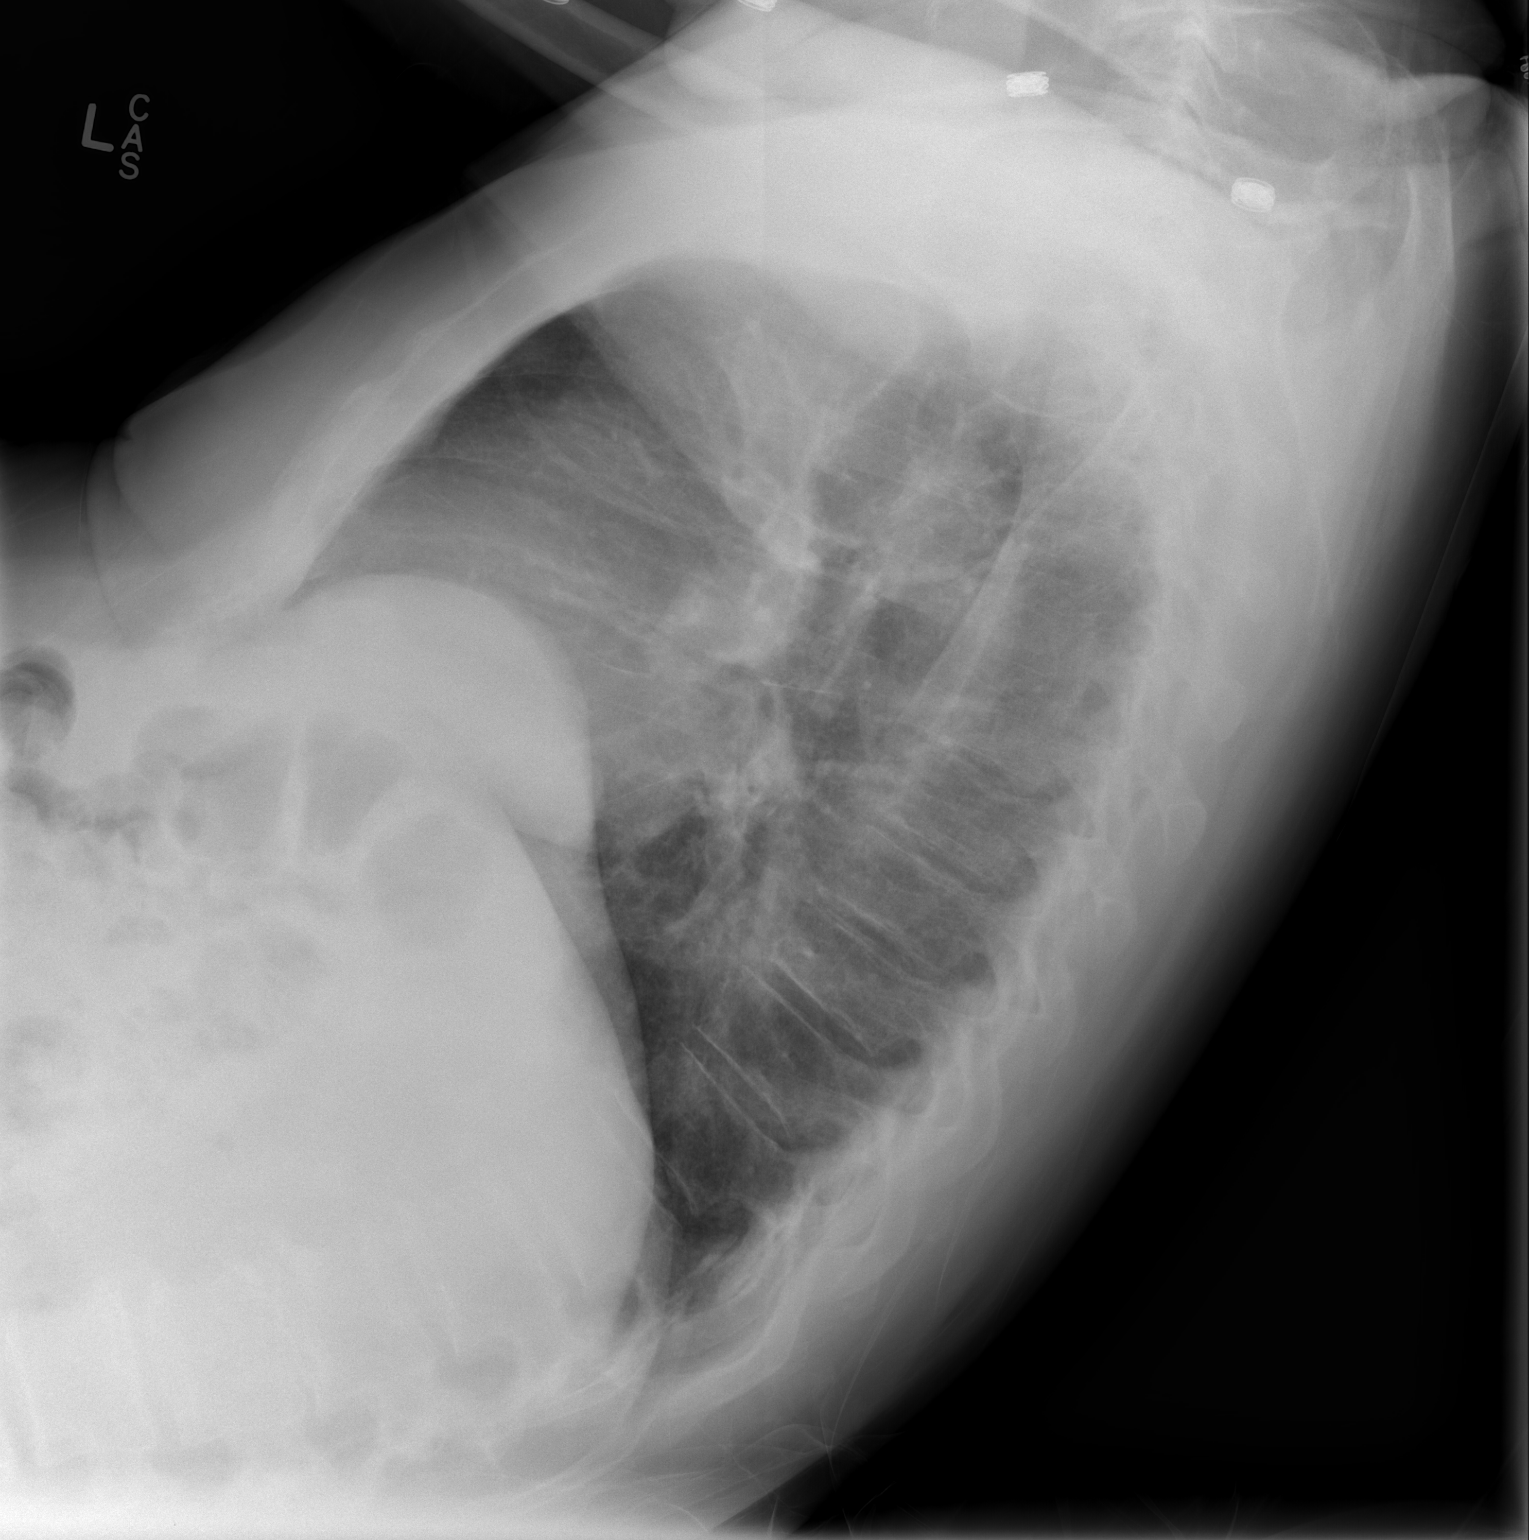

[view not recorded]
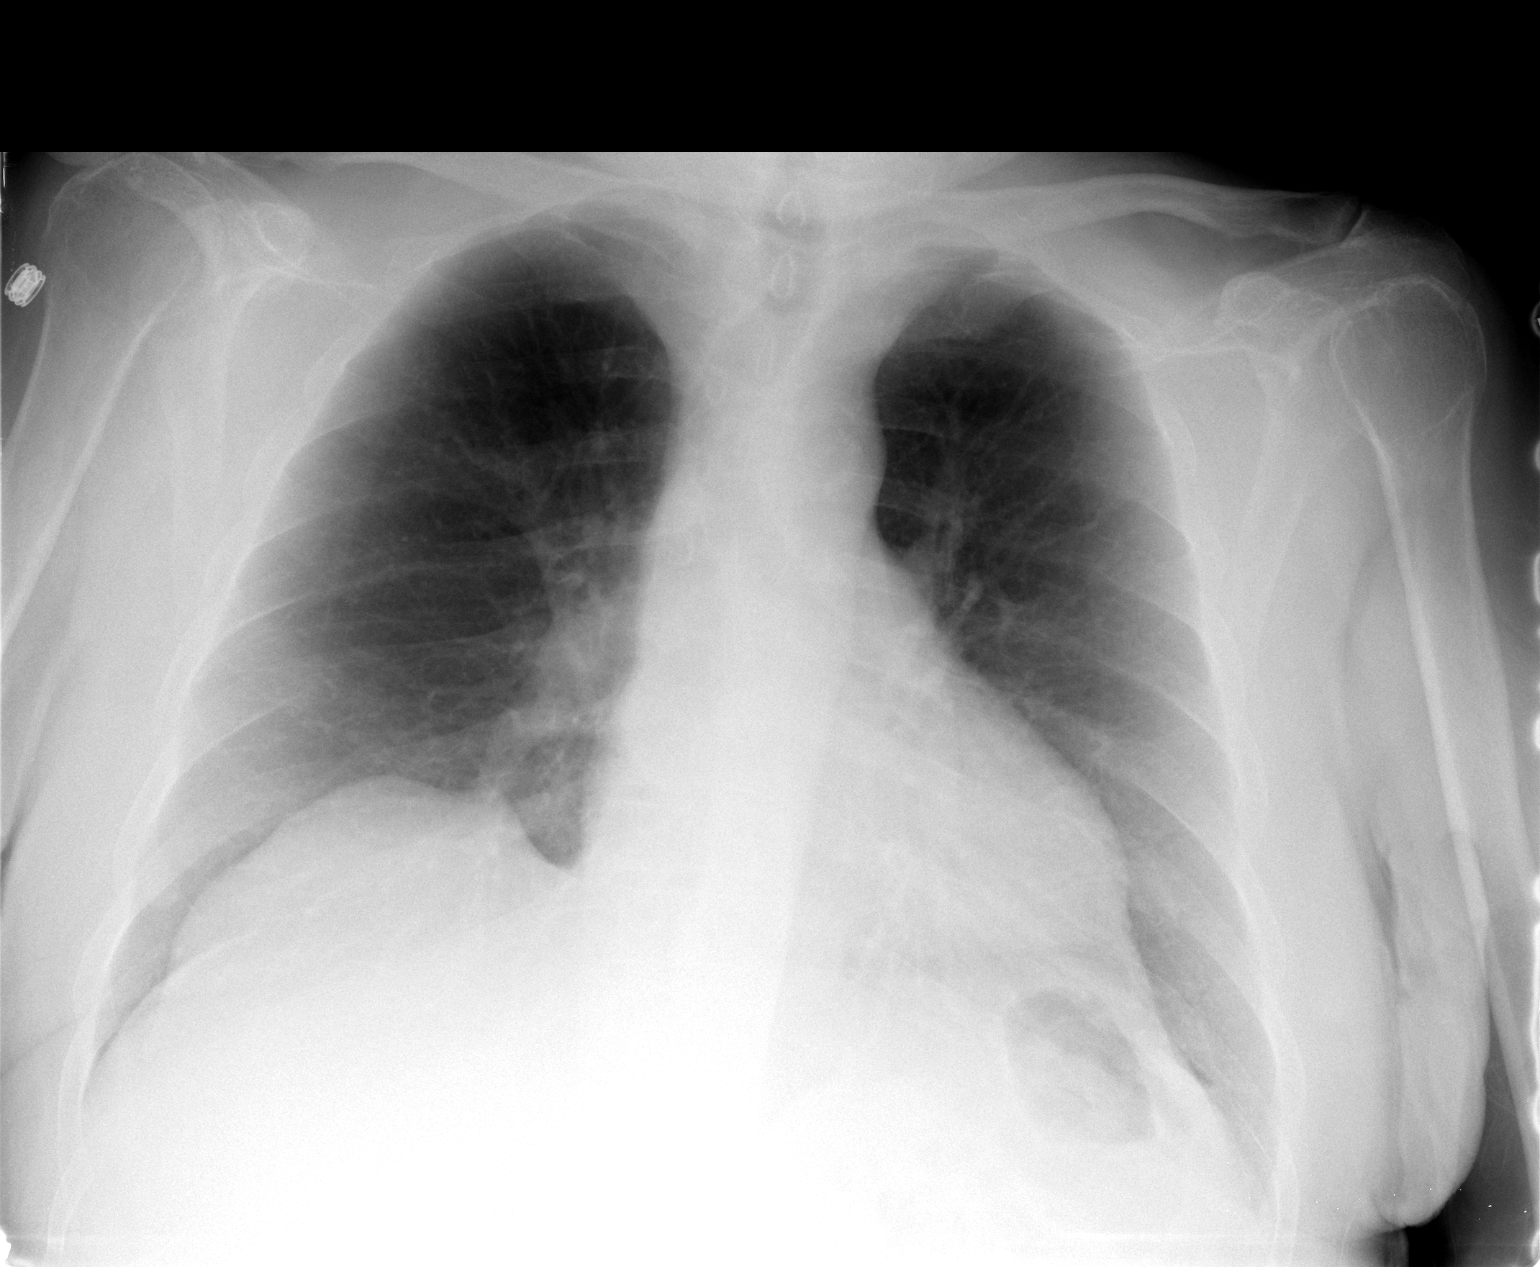

[2 of 2 positions shown; findings below may reference images not displayed]

FINDINGS: Lungs are clear. No pleural effusion or pneumothorax.

Cardiomediastinal silhouette is within normal limits.

Mild degenerative changes of the visualized thoracolumbar spine.
IMPRESSION: No evidence of acute cardiopulmonary disease.

## 2012-08-21 ENCOUNTER — Telehealth: Payer: Self-pay | Admitting: Family Medicine

## 2012-08-21 NOTE — Telephone Encounter (Signed)
Tried to call Pt back phone just rang will try again in Am.

## 2012-08-21 NOTE — Telephone Encounter (Signed)
Pt will need lab visit for LFTs 6-8 weeks after starting med (dx 272.4)

## 2012-08-21 NOTE — Telephone Encounter (Signed)
called regarding pt not taking lipitor that was Rx 07/2012-but he is now taking it and wants to know if patient needs to be scheduled for a follow up in 30-days please review and advise  CB# [phone]

## 2012-08-22 ENCOUNTER — Encounter: Payer: Self-pay | Admitting: Family Medicine

## 2012-08-22 NOTE — Telephone Encounter (Signed)
Letter mailed 08/22/12. [facility]

## 2012-08-22 NOTE — Telephone Encounter (Signed)
Tried to call VM full and no answer on other line. Ok to [employer]

## 2012-08-22 NOTE — Telephone Encounter (Signed)
called 11.20.13 * 11.21.13 no answer, called Home Phone for patient unable to leave mess mailbox is full will mail a letter if necessary--please advised

## 2012-09-09 ENCOUNTER — Telehealth: Payer: Self-pay | Admitting: Family Medicine

## 2012-09-09 NOTE — Telephone Encounter (Signed)
Patient's son called and would like to know if the patient needs both the lab appt in January and the appt in March or if they can just come to one appointment. Please advise.

## 2012-09-13 NOTE — Telephone Encounter (Signed)
Pt is due for his physical in March.  They can cancel the January appt if it is difficult to get here.

## 2012-09-13 NOTE — Telephone Encounter (Signed)
Attempted to contact pt to let them know is ok w/ Dr. Beverely Low if they need tocancel January appt sicne pt is scheduled for CPE in March, no answer at home number.

## 2012-09-17 NOTE — Telephone Encounter (Signed)
Called pt and pt son. No voicemail available.

## 2012-10-14 ENCOUNTER — Other Ambulatory Visit (INDEPENDENT_AMBULATORY_CARE_PROVIDER_SITE_OTHER): Payer: Medicare Other

## 2012-10-14 DIAGNOSIS — E785 Hyperlipidemia, unspecified: Secondary | ICD-10-CM

## 2012-10-14 LAB — LIPID PANEL
Cholesterol: 128 mg/dL (ref 0–200)
LDL Cholesterol: 72 mg/dL (ref 0–99)
Triglycerides: 90 mg/dL (ref 0.0–149.0)
VLDL: 18 mg/dL (ref 0.0–40.0)

## 2012-10-16 ENCOUNTER — Encounter: Payer: Self-pay | Admitting: *Deleted

## 2012-11-30 ENCOUNTER — Other Ambulatory Visit: Payer: Self-pay | Admitting: Family Medicine

## 2012-12-02 NOTE — Telephone Encounter (Signed)
Also add refill Atorvastatin Calcium 10MG  Tab #30 take 1 tablet by mouth daily last fill 3.1.14 (per fax)

## 2012-12-18 ENCOUNTER — Encounter: Payer: Medicare Other | Admitting: Family Medicine

## 2013-01-10 IMAGING — CR DG WRIST COMPLETE 3+V*R*
2 series · 2 of 2 positions shown · non-contrast
Comparison: None

CLINICAL DATA: Pain in navicular area.  No known injury.

RIGHT WRIST - COMPLETE 3+ VIEW

[view not recorded (1 of 2)]
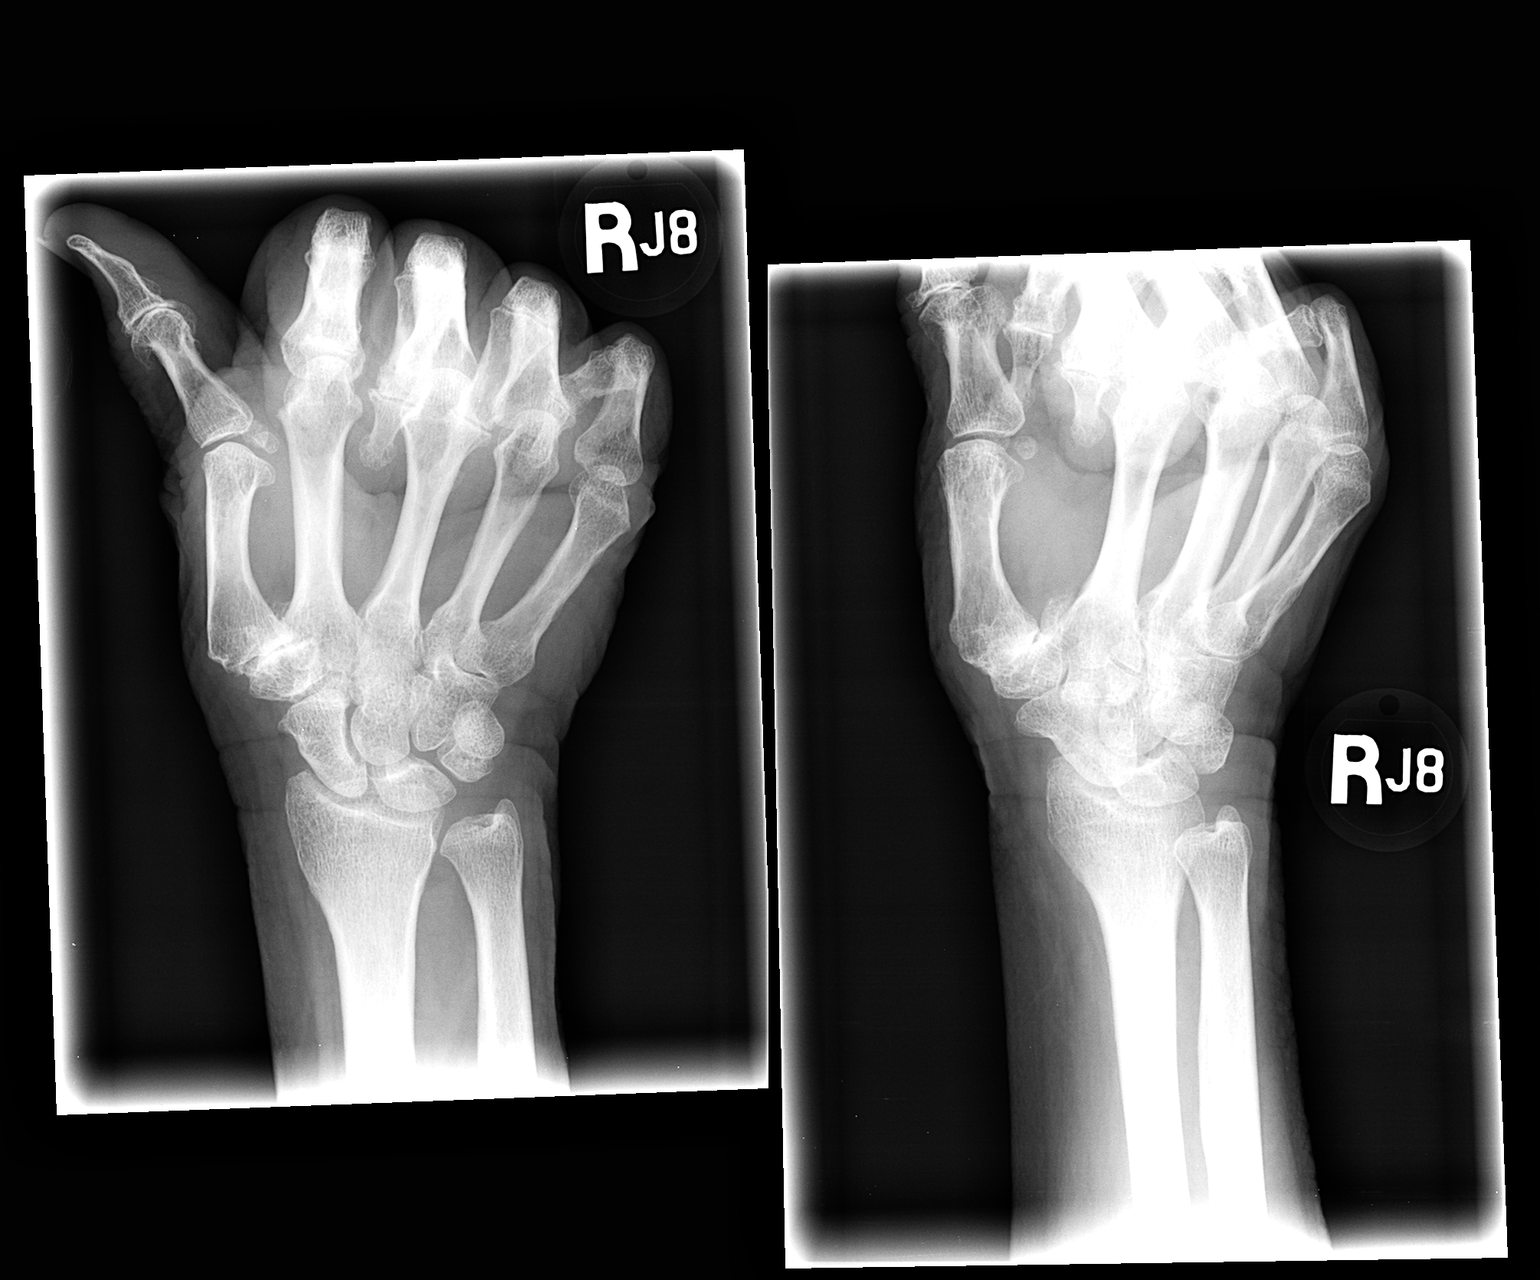

[view not recorded (2 of 2)]
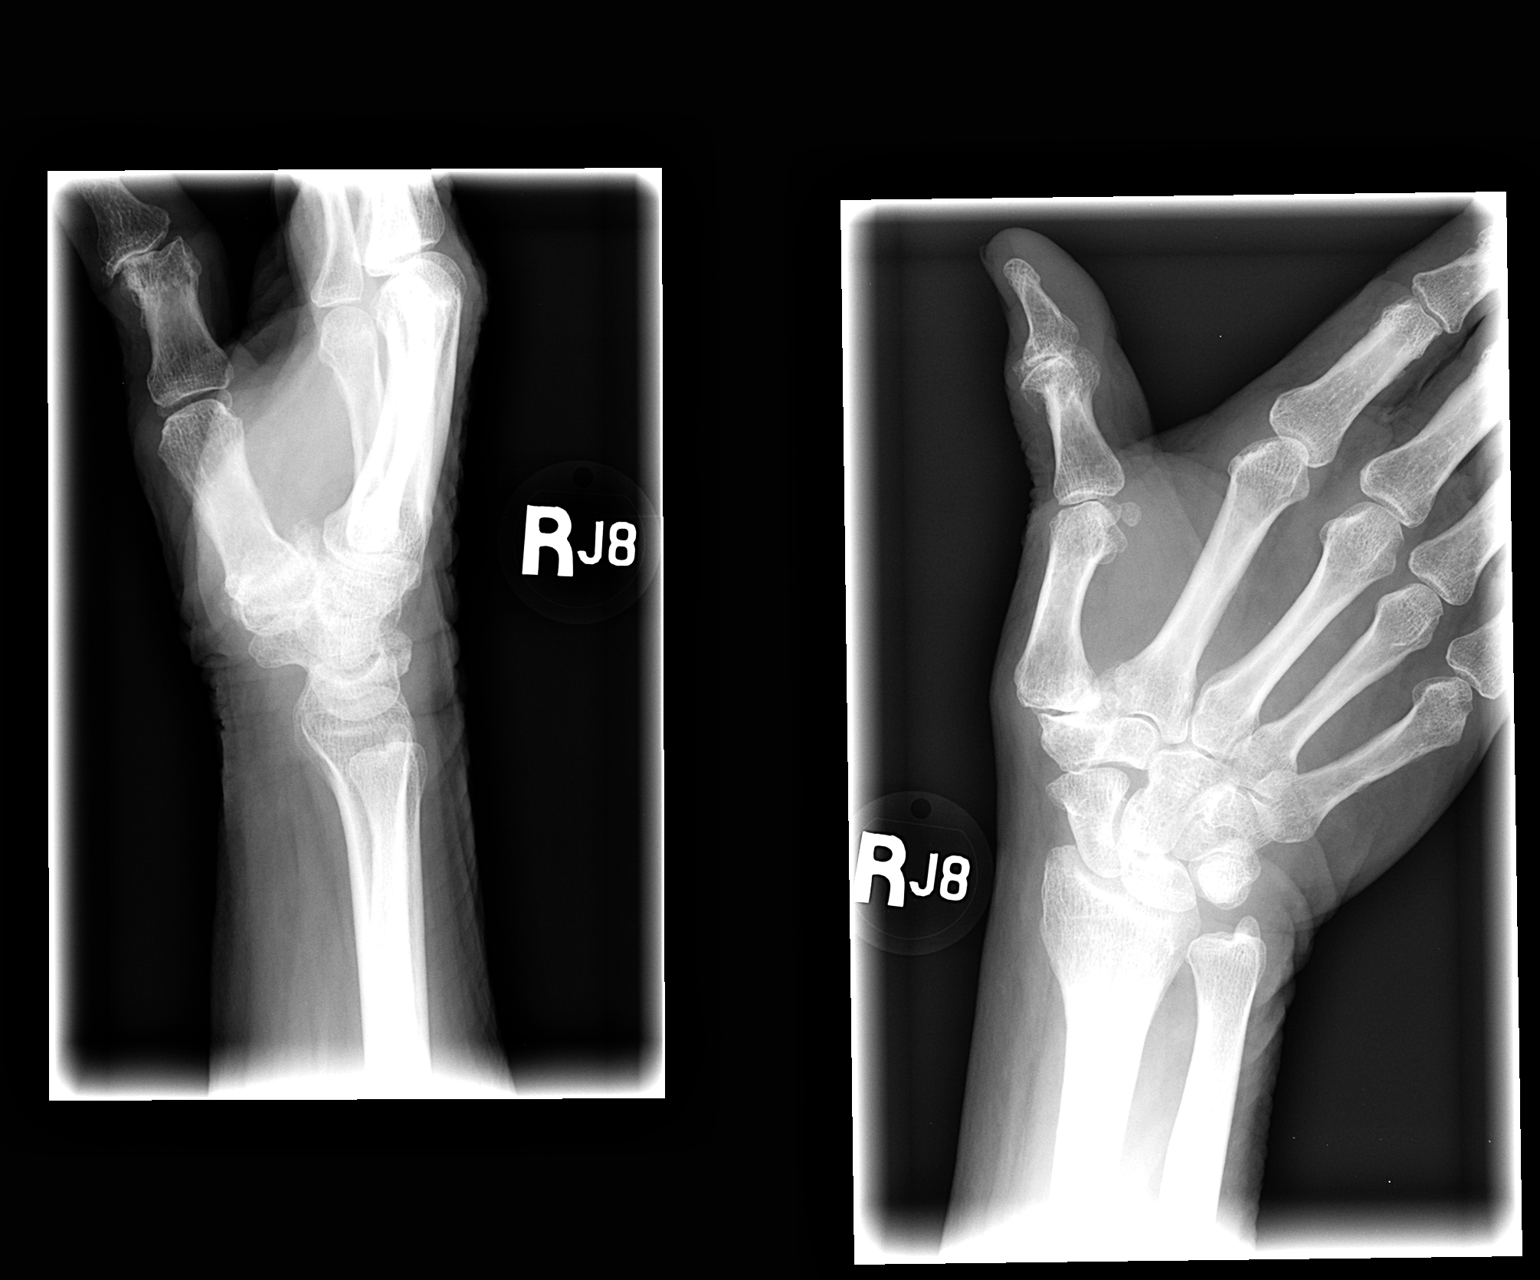

[2 of 2 positions shown; findings below may reference images not displayed]

FINDINGS: Degenerative changes at the first carpal metacarpal
joint. No acute bony abnormality.  Specifically, no fracture,
subluxation, or dislocation.  Soft tissues are intact.
IMPRESSION: No acute bony abnormality.

## 2013-01-19 IMAGING — CR DG CHEST 2V
2 series · 2 of 2 positions shown · non-contrast
Comparison: 05/10/2011

CLINICAL DATA: Cough.

CHEST - 2 VIEW

[view not recorded (1 of 2)]
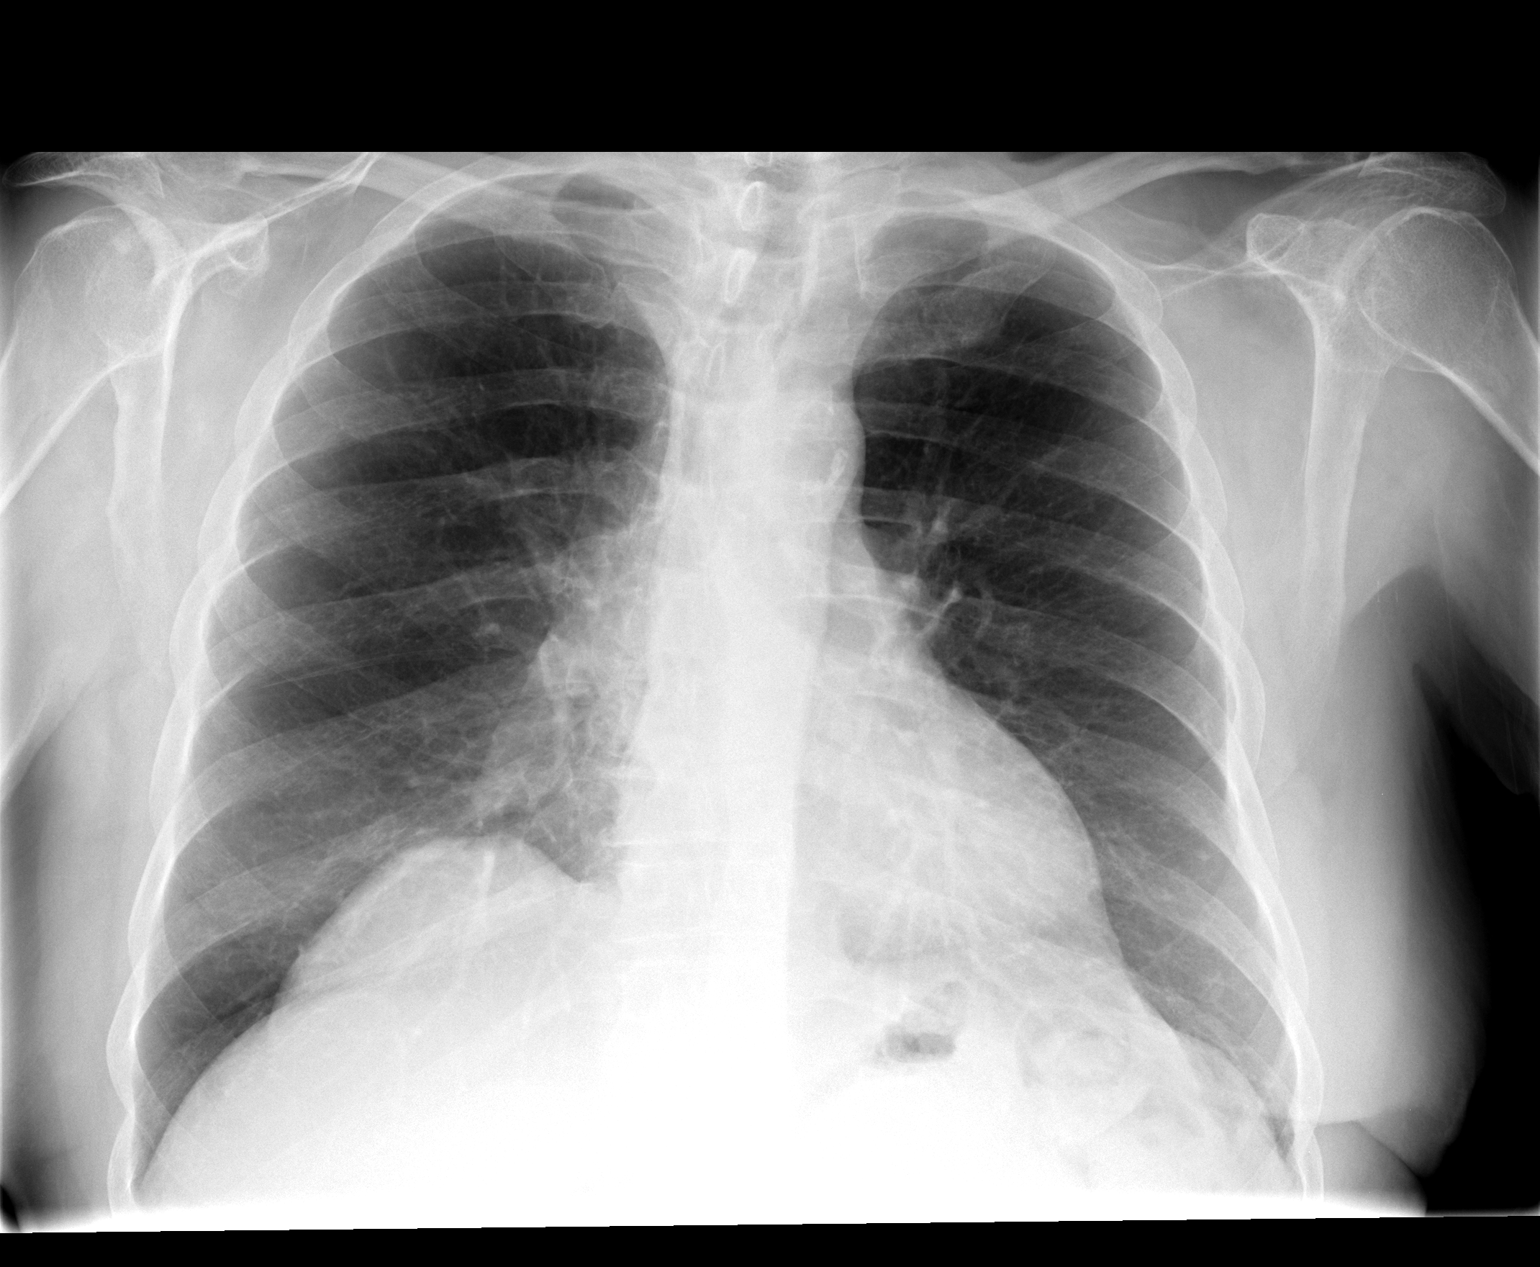

[view not recorded (2 of 2)]
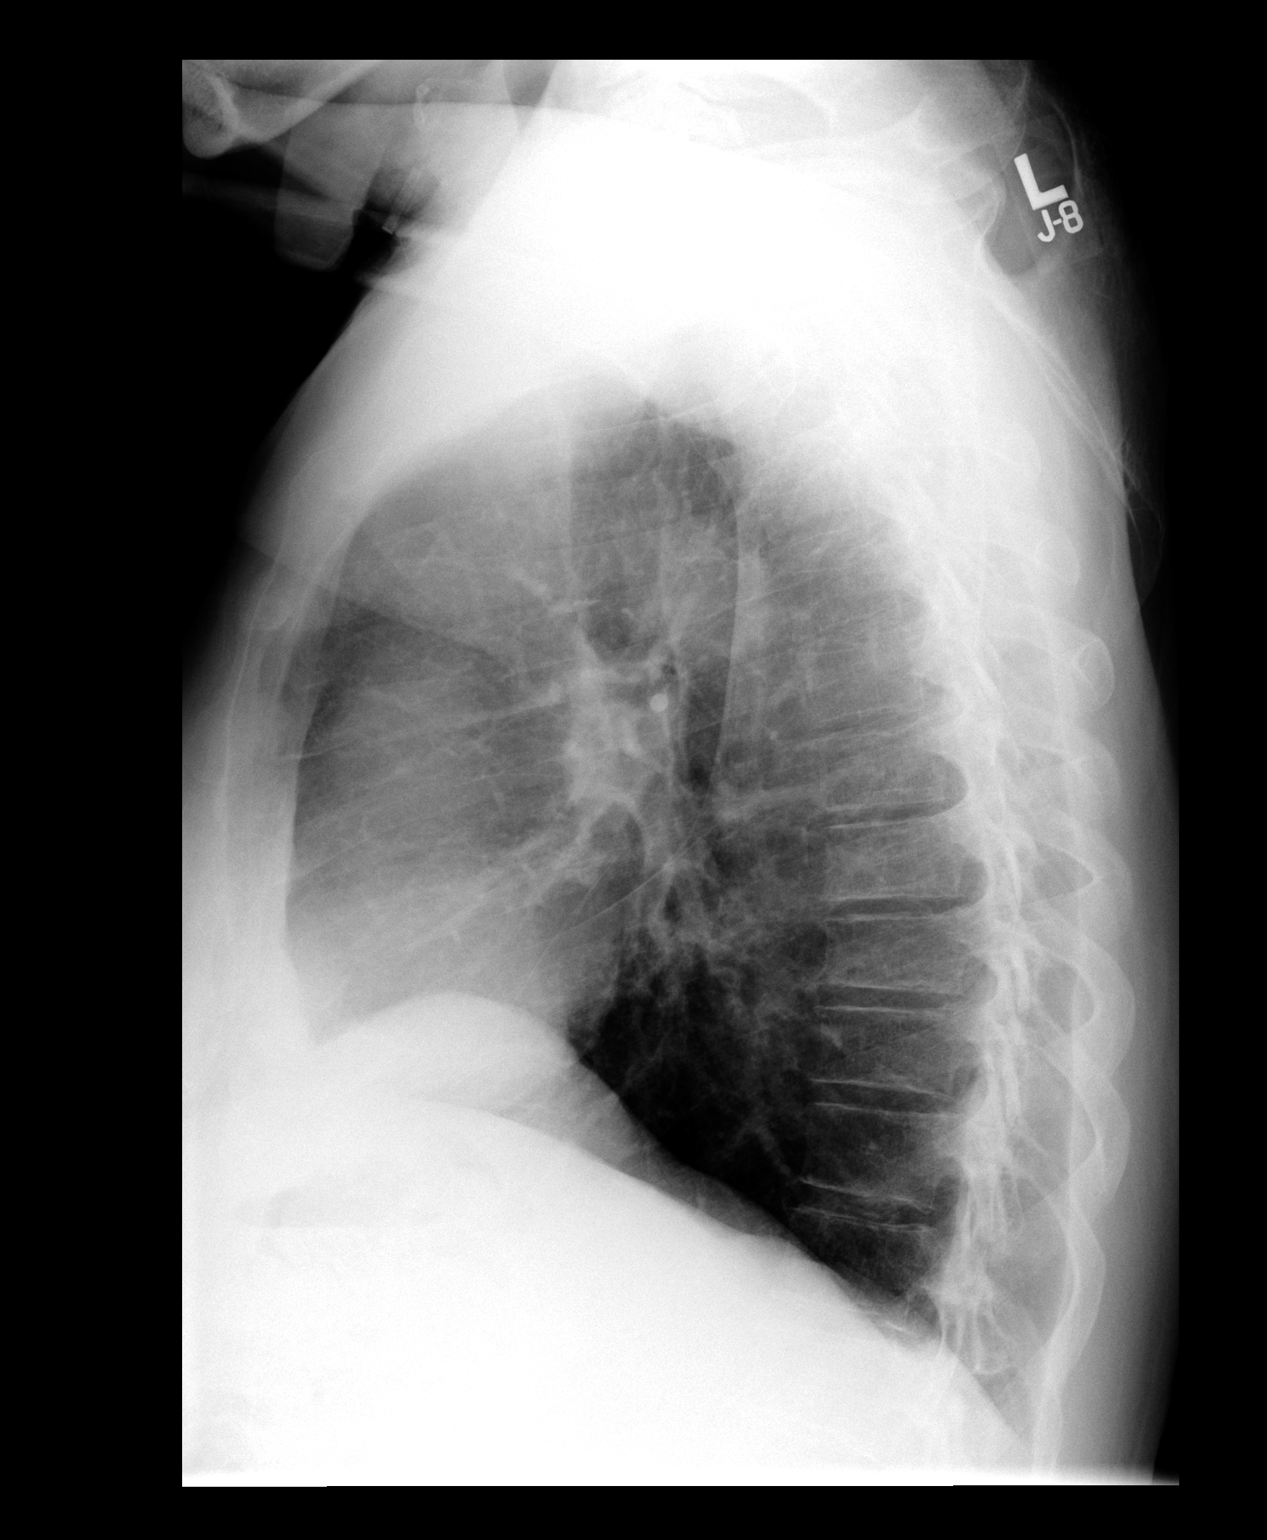

[2 of 2 positions shown; findings below may reference images not displayed]

FINDINGS: Heart and mediastinal contours are within normal limits.
No focal opacities or effusions.  No acute bony abnormality.
Eventration of the right hemidiaphragm, stable.
IMPRESSION: No active cardiopulmonary disease.

## 2013-01-29 ENCOUNTER — Encounter: Payer: Self-pay | Admitting: Family Medicine

## 2013-01-29 ENCOUNTER — Ambulatory Visit (INDEPENDENT_AMBULATORY_CARE_PROVIDER_SITE_OTHER): Payer: Medicare Other | Admitting: Family Medicine

## 2013-01-29 VITALS — BP 130/80 | HR 58 | Temp 97.4°F | Ht 65.25 in | Wt 149.8 lb

## 2013-01-29 DIAGNOSIS — Z1331 Encounter for screening for depression: Secondary | ICD-10-CM

## 2013-01-29 DIAGNOSIS — Z0000 Encounter for general adult medical examination without abnormal findings: Secondary | ICD-10-CM

## 2013-01-29 DIAGNOSIS — I10 Essential (primary) hypertension: Secondary | ICD-10-CM

## 2013-01-29 DIAGNOSIS — E785 Hyperlipidemia, unspecified: Secondary | ICD-10-CM

## 2013-01-29 NOTE — Patient Instructions (Addendum)
Follow up in 6 months to recheck BP and cholesterol We'll notify you of your lab results and make any changes if needed Keep up the good work! Call with any questions or concerns Happy Spring!!! 

## 2013-01-29 NOTE — Progress Notes (Signed)
  Subjective:    Patient ID: Randy Cochran, male    DOB: Jan 10, 1926, 79 y.o.   MRN: [id number]  HPI Here today for CPE.  Risk Factors: HTN- chronic problem, on Amlodipine and Lisinopril HCTZ.  No CP, SOB, HAs, edema.  + visual changes- following w/ eye specialist Hyperlipidemia- chronic problem, on Lipitor nightly.  No abd pain, N/V, myalgias  Physical Activity: no exercise Fall Risk: moderate- has fallen previously, has fear of falling due to visual limitations Depression: no current symptoms Hearing: decreased to conversational tones and whispered voice, wears hearing aide ADL's: independent Cognitive: normal linear thought process, memory and attention intact Home Safety: safe at home, lives w/ son Height, Weight, BMI, Visual Acuity: see vitals, vision corrected w/ glasses Counseling:  Pt has never had colonoscopy 'i don't think it's necessary'. Labs Ordered: See A&P Care Plan: See A&P    Review of Systems Patient reports no vision/hearing changes, anorexia, fever ,adenopathy, persistant/recurrent hoarseness, swallowing issues, chest pain, palpitations, edema, persistant/recurrent cough, hemoptysis, dyspnea (rest,exertional, paroxysmal nocturnal), gastrointestinal  bleeding (melena, rectal bleeding), abdominal pain, excessive heart burn, GU symptoms (dysuria, hematuria, voiding/incontinence issues) syncope, focal weakness, memory loss, numbness & tingling, skin/hair/nail changes, depression, anxiety, abnormal bruising/bleeding, musculoskeletal symptoms/signs.     Objective:   Physical Exam BP 130/80  Pulse 58  Temp(Src) 97.4 F (36.3 C) (Oral)  Ht 5' 5.25" (1.657 m)  Wt 149 lb 12.8 oz (67.949 kg)  BMI 24.75 kg/m2  SpO2 97%  General Appearance:    Alert, cooperative, no distress, appears stated age.  + odor, disheveled appearance   Head:    Normocephalic, without obvious abnormality, atraumatic  Eyes:    PERRL, conjunctiva/corneas clear, EOM's intact, fundi    benign, both eyes        Ears:    Normal TM's and external ear canals, both ears  Nose:   Nares normal, septum midline, mucosa normal, no drainage   or sinus tenderness  Throat:   Lips, mucosa, and tongue normal; teeth and gums normal  Neck:   Supple, symmetrical, trachea midline, no adenopathy;       thyroid:  No enlargement/tenderness/nodules  Back:     Symmetric, no curvature, ROM normal, no CVA tenderness  Lungs:     Clear to auscultation bilaterally, respirations unlabored  Chest wall:    No tenderness or deformity  Heart:    Regular rate and rhythm, S1 and S2 normal, no murmur, rub   or gallop  Abdomen:     Soft, non-tender, bowel sounds active all four quadrants,    no masses, no organomegaly  Genitalia:    Deferred at pt's request  Rectal:    Extremities:   Extremities normal, atraumatic, no cyanosis or edema  Pulses:   2+ and symmetric all extremities  Skin:   Skin color, texture, turgor normal, no rashes or lesions  Lymph nodes:   Cervical, supraclavicular, and axillary nodes normal  Neurologic:   CNII-XII intact. Normal strength, sensation and reflexes      throughout          Assessment & Plan:

## 2013-01-29 NOTE — Assessment & Plan Note (Signed)
Chronic problem, well controlled today.  Asymptomatic.  Check labs- no anticipated changes

## 2013-01-29 NOTE — Assessment & Plan Note (Signed)
Chronic problem, tolerating statin w/out difficulty.  Check labs.  Adjust meds prn  

## 2013-01-29 NOTE — Assessment & Plan Note (Signed)
Pt's PE WNL w/ exception of odor and disheveled appearance.  No need for colonoscopy at this age.  Pt also declines testicular and rectal exam.  No PSA due to age.  Encouraged pt to bathe regularly and try and remain clean in the interest of his health.  Check labs.  Anticipatory guidance provided.

## 2013-01-30 LAB — LIPID PANEL
Cholesterol: 135 mg/dL (ref 0–200)
LDL Cholesterol: 62 mg/dL (ref 0–99)
Triglycerides: 170 mg/dL — ABNORMAL HIGH (ref 0.0–149.0)
VLDL: 34 mg/dL (ref 0.0–40.0)

## 2013-01-30 LAB — HEPATIC FUNCTION PANEL
ALT: 16 U/L (ref 0–53)
Total Bilirubin: 0.7 mg/dL (ref 0.3–1.2)

## 2013-01-30 LAB — BASIC METABOLIC PANEL
BUN: 26 mg/dL — ABNORMAL HIGH (ref 6–23)
Creatinine, Ser: 1.4 mg/dL (ref 0.4–1.5)
GFR: 52.96 mL/min — ABNORMAL LOW (ref >60.00)

## 2013-01-31 ENCOUNTER — Encounter: Payer: Self-pay | Admitting: *Deleted

## 2013-02-01 ENCOUNTER — Other Ambulatory Visit: Payer: Self-pay | Admitting: Family Medicine

## 2013-03-07 ENCOUNTER — Other Ambulatory Visit: Payer: Self-pay | Admitting: General Practice

## 2013-03-07 MED ORDER — ATORVASTATIN CALCIUM 10 MG PO TABS: 10.0000 mg | ORAL_TABLET | Freq: Every day | ORAL | Status: DC

## 2013-03-07 NOTE — Telephone Encounter (Signed)
Med filled.  

## 2013-05-04 ENCOUNTER — Other Ambulatory Visit: Payer: Self-pay | Admitting: Family Medicine

## 2013-06-07 ENCOUNTER — Other Ambulatory Visit: Payer: Self-pay | Admitting: Family Medicine

## 2013-06-09 ENCOUNTER — Other Ambulatory Visit: Payer: Self-pay | Admitting: *Deleted

## 2013-06-09 DIAGNOSIS — I10 Essential (primary) hypertension: Secondary | ICD-10-CM

## 2013-06-09 MED ORDER — LISINOPRIL-HYDROCHLOROTHIAZIDE 20-12.5 MG PO TABS: ORAL_TABLET | ORAL | Status: AC

## 2013-06-09 NOTE — Telephone Encounter (Signed)
RF for prinzide sent to Karin Golden at [facility]

## 2013-08-31 ENCOUNTER — Other Ambulatory Visit: Payer: Self-pay | Admitting: Family Medicine

## 2013-09-01 NOTE — Telephone Encounter (Signed)
Med filled.  

## 2013-10-06 ENCOUNTER — Other Ambulatory Visit: Payer: Self-pay | Admitting: Family Medicine

## 2013-10-06 NOTE — Telephone Encounter (Signed)
Med filled for 1 month supply. Patient is due for an office visit

## 2013-10-10 ENCOUNTER — Other Ambulatory Visit: Payer: Self-pay | Admitting: Family Medicine

## 2013-10-10 NOTE — Telephone Encounter (Signed)
Last OV 01-29-13 Med filled 08-31-13 #30 with 0  No Upcoming appts,

## 2013-10-10 NOTE — Telephone Encounter (Signed)
Med filled.  

## 2013-10-10 NOTE — Telephone Encounter (Signed)
Ok for #30, needs to schedule f/u app

## 2013-11-07 ENCOUNTER — Other Ambulatory Visit: Payer: Self-pay | Admitting: Family Medicine

## 2013-11-07 NOTE — Telephone Encounter (Signed)
Med filled.  

## 2013-11-26 ENCOUNTER — Ambulatory Visit: Payer: Medicare Other | Admitting: Family Medicine

## 2013-12-02 ENCOUNTER — Ambulatory Visit: Payer: Medicare Other | Admitting: Family Medicine

## 2013-12-05 ENCOUNTER — Ambulatory Visit: Payer: Medicare Other | Admitting: Family Medicine

## 2013-12-06 ENCOUNTER — Other Ambulatory Visit: Payer: Self-pay | Admitting: Family Medicine

## 2013-12-08 NOTE — Telephone Encounter (Signed)
Med filled and letter mailed.  

## 2013-12-10 ENCOUNTER — Telehealth: Payer: Self-pay
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
## 2013-12-31 DEATH — deceased
